# Patient Record
Sex: Male | Born: 1940 | Race: White | Hispanic: No | Smoking: Former smoker
Health system: Southern US, Community
[De-identification: ages and names within clinical notes are randomized; demographics above are authoritative.]

## PROBLEM LIST (undated history)

## (undated) DIAGNOSIS — M797 Fibromyalgia: Secondary | ICD-10-CM

## (undated) DIAGNOSIS — M7710 Lateral epicondylitis, unspecified elbow: Secondary | ICD-10-CM

## (undated) DIAGNOSIS — N529 Male erectile dysfunction, unspecified: Secondary | ICD-10-CM

## (undated) DIAGNOSIS — T884XXA Failed or difficult intubation, initial encounter: Secondary | ICD-10-CM

## (undated) DIAGNOSIS — F419 Anxiety disorder, unspecified: Secondary | ICD-10-CM

## (undated) DIAGNOSIS — K3580 Unspecified acute appendicitis: Secondary | ICD-10-CM

## (undated) DIAGNOSIS — N281 Cyst of kidney, acquired: Secondary | ICD-10-CM

## (undated) DIAGNOSIS — R600 Localized edema: Secondary | ICD-10-CM

## (undated) DIAGNOSIS — J301 Allergic rhinitis due to pollen: Secondary | ICD-10-CM

## (undated) DIAGNOSIS — J309 Allergic rhinitis, unspecified: Secondary | ICD-10-CM

## (undated) DIAGNOSIS — K219 Gastro-esophageal reflux disease without esophagitis: Secondary | ICD-10-CM

## (undated) DIAGNOSIS — IMO0002 Reserved for concepts with insufficient information to code with codable children: Secondary | ICD-10-CM

## (undated) DIAGNOSIS — I10 Essential (primary) hypertension: Secondary | ICD-10-CM

## (undated) DIAGNOSIS — R0681 Apnea, not elsewhere classified: Secondary | ICD-10-CM

## (undated) HISTORY — DX: Apnea, not elsewhere classified: R06.81

## (undated) HISTORY — DX: Reserved for concepts with insufficient information to code with codable children: IMO0002

## (undated) HISTORY — DX: Cyst of kidney, acquired: N28.1

## (undated) HISTORY — DX: Fibromyalgia: M79.7

## (undated) HISTORY — DX: Anxiety disorder, unspecified: F41.9

## (undated) HISTORY — PX: KNEE ARTHROSCOPY: SUR90

## (undated) HISTORY — DX: Allergic rhinitis due to pollen: J30.1

## (undated) HISTORY — DX: Gastro-esophageal reflux disease without esophagitis: K21.9

## (undated) HISTORY — PX: TONSILLECTOMY: SUR1361

## (undated) HISTORY — DX: Lateral epicondylitis, unspecified elbow: M77.10

## (undated) HISTORY — PX: INGUINAL HERNIA REPAIR: SUR1180

## (undated) HISTORY — DX: Male erectile dysfunction, unspecified: N52.9

## (undated) HISTORY — DX: Allergic rhinitis, unspecified: J30.9

## (undated) HISTORY — DX: Essential (primary) hypertension: I10

## (undated) HISTORY — PX: CATARACT EXTRACTION, BILATERAL: SHX1313

## (undated) HISTORY — DX: Localized edema: R60.0

---

## 2000-09-29 ENCOUNTER — Encounter: Payer: Self-pay | Admitting: General Surgery

## 2000-10-02 ENCOUNTER — Ambulatory Visit (HOSPITAL_COMMUNITY): Admission: RE | Admit: 2000-10-02 | Discharge: 2000-10-02 | Payer: Self-pay | Admitting: General Surgery

## 2005-06-13 ENCOUNTER — Encounter: Admission: RE | Admit: 2005-06-13 | Discharge: 2005-06-13 | Payer: Self-pay | Admitting: Internal Medicine

## 2005-06-27 ENCOUNTER — Encounter: Admission: RE | Admit: 2005-06-27 | Discharge: 2005-06-27 | Payer: Self-pay | Admitting: Internal Medicine

## 2006-04-21 ENCOUNTER — Emergency Department (HOSPITAL_COMMUNITY): Admission: EM | Admit: 2006-04-21 | Discharge: 2006-04-21 | Payer: Self-pay | Admitting: Emergency Medicine

## 2008-11-11 ENCOUNTER — Emergency Department (HOSPITAL_COMMUNITY): Admission: EM | Admit: 2008-11-11 | Discharge: 2008-11-11 | Payer: Self-pay | Admitting: Emergency Medicine

## 2011-04-12 NOTE — Op Note (Signed)
[facility]  Patient:    Nathan Delgado, Nathan Delgado                  MRN: [id number] Proc. Date: 10/02/00 Adm. Date:  [id number] Attending:  Brandy Hale CC:         Winn Jock. Earl Gala, M.D.   Operative Report  PREOPERATIVE DIAGNOSIS:  Right inguinal hernia.  POSTOPERATIVE DIAGNOSIS:  Right inguinal hernia.  OPERATION PERFORMED:  Repair of right inguinal hernia with mesh Armanda Heritage repair).  SURGEON:  Angelia Mould. Derrell Lolling, M.D.  OPERATIVE INDICATIONS:  This is a 70 year old white man who who has had a bulge and some discomfort in his right groin for some time.  On examination, he has a small to medium-sized right inguinal hernia which is completely reducible.  He was advised to have this repaired electively, and he has chosen to have that done at this time.  He is brought to the operating room electively for repair of his right inguinal hernia.  DESCRIPTION OF PROCEDURE:  The patient was brought the operating room and placed supine on the operating table.  The right groin was prepped and draped in a sterile fashion.  One percent Xylocaine with epinephrine was used as a local infiltration anesthetic.  We initially used a monitored sedation technique, but because of the patients breathing pattern and repetitive tension on his abdominal wall, ultimately to have a general anesthetic.  An oblique incision was made in the right groin.  Dissection was carried down through the subcutaneous tissue, exposing the aponeurosis of the external oblique.  The external oblique was incised in the direction of its fibers, opening up the external inguinal ring.  The cord structures and ilioinguinal nerve were identified, mobilized, and preserved.  We dissected out a medium size direct inguinal hernia and separated this from the cord structures.  We reduced the direct hernia component and oversewed the overlying tissues with a running suture of 2-0 Vicryl to hold the  reduction in place through the repair.  We then skeletonized the premesenteric fibers of the cord and examined this circumferentially at the level of the internal ring.  There was no evidence for an indirect hernia.  The floor of the inguinal canal was repaired and reinforced with an onlay graft of polypropylene mesh.  The mesh was cut in appropriate shape, approximately 3 inches by 6 inches, and sutured in place with running sutures of 2-0 Prolene.  The mesh was sutured so as to generously overlap the fascia at the pubic tubercle, along the ligament inferiorly, along the internal oblique muscle superiorly.  The mesh was incised laterally so as to wrap around the cord structures at the internal ring.  The tails of the mesh were overlapped laterally and the suture lines completed.  This provided a very secure repair both medial and lateral to the internal ring, and covered the direct defect very nicely.  This allowed an adequate opening for the cord structures.  The wound was irrigated with saline.  Hemostasis was excellent.  The external oblique was closed with a running suture of 2-0 Vicryl placed in the cord structures deep to the external oblique.  The Scarpas fascia was closed with 3-0 Vicryl and the skin closed with a running subcuticular suture of 4-0 Vicryl and Steri-Strips.  Clean bandages were placed and the patient taken to the recovery room in stable condition.  Estimated blood loss was about 10-15 cc.  Complications none.  Sponge, needle, and instrument counts were correct. DD:  10/02/00 TD:  10/02/00 Job: [id number]

## 2011-05-24 ENCOUNTER — Other Ambulatory Visit: Payer: Self-pay | Admitting: Orthopaedic Surgery

## 2011-05-24 DIAGNOSIS — M25512 Pain in left shoulder: Secondary | ICD-10-CM

## 2011-06-01 ENCOUNTER — Ambulatory Visit
Admission: RE | Admit: 2011-06-01 | Discharge: 2011-06-01 | Disposition: A | Discharge status: Home / Self Care | Payer: Self-pay | Source: Ambulatory Visit | Attending: Orthopaedic Surgery | Admitting: Orthopaedic Surgery

## 2011-06-01 DIAGNOSIS — M25512 Pain in left shoulder: Secondary | ICD-10-CM

## 2011-08-29 LAB — COMPREHENSIVE METABOLIC PANEL
AST: 29 U/L (ref 0–37)
Albumin: 4 g/dL (ref 3.5–5.2)
Alkaline Phosphatase: 49 U/L (ref 39–117)
BUN: 11 mg/dL (ref 6–23)
CO2: 28 mEq/L (ref 19–32)
Chloride: 104 mEq/L (ref 96–112)
Creatinine, Ser: 0.86 mg/dL (ref 0.4–1.5)
GFR calc Af Amer: >60 mL/min (ref >60)
GFR calc non Af Amer: >60 mL/min (ref >60)
Potassium: 4 mEq/L (ref 3.5–5.1)
Total Bilirubin: 1 mg/dL (ref 0.3–1.2)

## 2011-08-29 LAB — CBC
HCT: 46.2 % (ref 39.0–52.0)
MCV: 90.3 fL (ref 78.0–100.0)
Platelets: 227 10*3/uL (ref 150–400)
RBC: 5.11 MIL/uL (ref 4.22–5.81)
WBC: 5.2 10*3/uL (ref 4.0–10.5)

## 2011-08-29 LAB — DIFFERENTIAL
Basophils Absolute: 0 10*3/uL (ref 0.0–0.1)
Basophils Relative: 0 % (ref 0–1)
Eosinophils Absolute: 0.1 10*3/uL (ref 0.0–0.7)
Eosinophils Relative: 1 % (ref 0–5)
Lymphocytes Relative: 17 % (ref 12–46)
Monocytes Absolute: 0.4 10*3/uL (ref 0.1–1.0)

## 2011-08-29 LAB — URINALYSIS, ROUTINE W REFLEX MICROSCOPIC
Glucose, UA: NEGATIVE mg/dL
Hgb urine dipstick: NEGATIVE
Specific Gravity, Urine: 1.016 (ref 1.005–1.030)

## 2011-12-09 DIAGNOSIS — M25519 Pain in unspecified shoulder: Secondary | ICD-10-CM | POA: Diagnosis not present

## 2011-12-17 DIAGNOSIS — M719 Bursopathy, unspecified: Secondary | ICD-10-CM | POA: Diagnosis not present

## 2011-12-17 DIAGNOSIS — M67919 Unspecified disorder of synovium and tendon, unspecified shoulder: Secondary | ICD-10-CM | POA: Diagnosis not present

## 2011-12-17 DIAGNOSIS — M25519 Pain in unspecified shoulder: Secondary | ICD-10-CM | POA: Diagnosis not present

## 2012-01-02 DIAGNOSIS — H26499 Other secondary cataract, unspecified eye: Secondary | ICD-10-CM | POA: Diagnosis not present

## 2012-02-06 DIAGNOSIS — H26499 Other secondary cataract, unspecified eye: Secondary | ICD-10-CM | POA: Diagnosis not present

## 2012-02-28 DIAGNOSIS — M25519 Pain in unspecified shoulder: Secondary | ICD-10-CM | POA: Diagnosis not present

## 2012-03-27 DIAGNOSIS — H10019 Acute follicular conjunctivitis, unspecified eye: Secondary | ICD-10-CM | POA: Diagnosis not present

## 2012-04-21 DIAGNOSIS — I10 Essential (primary) hypertension: Secondary | ICD-10-CM | POA: Diagnosis not present

## 2012-04-21 DIAGNOSIS — Z1331 Encounter for screening for depression: Secondary | ICD-10-CM | POA: Diagnosis not present

## 2012-04-21 DIAGNOSIS — Z0000 Encounter for general adult medical examination without abnormal findings: Secondary | ICD-10-CM | POA: Diagnosis not present

## 2012-05-18 DIAGNOSIS — L719 Rosacea, unspecified: Secondary | ICD-10-CM | POA: Diagnosis not present

## 2012-06-11 DIAGNOSIS — H10439 Chronic follicular conjunctivitis, unspecified eye: Secondary | ICD-10-CM | POA: Diagnosis not present

## 2012-06-29 DIAGNOSIS — H10439 Chronic follicular conjunctivitis, unspecified eye: Secondary | ICD-10-CM | POA: Diagnosis not present

## 2012-07-09 DIAGNOSIS — H10439 Chronic follicular conjunctivitis, unspecified eye: Secondary | ICD-10-CM | POA: Diagnosis not present

## 2012-08-24 DIAGNOSIS — H10439 Chronic follicular conjunctivitis, unspecified eye: Secondary | ICD-10-CM | POA: Diagnosis not present

## 2012-08-28 DIAGNOSIS — Z23 Encounter for immunization: Secondary | ICD-10-CM | POA: Diagnosis not present

## 2012-10-20 DIAGNOSIS — IMO0001 Reserved for inherently not codable concepts without codable children: Secondary | ICD-10-CM | POA: Diagnosis not present

## 2012-10-20 DIAGNOSIS — I10 Essential (primary) hypertension: Secondary | ICD-10-CM | POA: Diagnosis not present

## 2013-01-28 DIAGNOSIS — H04129 Dry eye syndrome of unspecified lacrimal gland: Secondary | ICD-10-CM | POA: Diagnosis not present

## 2013-03-08 DIAGNOSIS — L570 Actinic keratosis: Secondary | ICD-10-CM | POA: Diagnosis not present

## 2013-03-08 DIAGNOSIS — D235 Other benign neoplasm of skin of trunk: Secondary | ICD-10-CM | POA: Diagnosis not present

## 2013-04-22 DIAGNOSIS — IMO0001 Reserved for inherently not codable concepts without codable children: Secondary | ICD-10-CM | POA: Diagnosis not present

## 2013-04-22 DIAGNOSIS — Z0000 Encounter for general adult medical examination without abnormal findings: Secondary | ICD-10-CM | POA: Diagnosis not present

## 2013-04-22 DIAGNOSIS — Z1331 Encounter for screening for depression: Secondary | ICD-10-CM | POA: Diagnosis not present

## 2013-04-22 DIAGNOSIS — I10 Essential (primary) hypertension: Secondary | ICD-10-CM | POA: Diagnosis not present

## 2013-04-22 DIAGNOSIS — K219 Gastro-esophageal reflux disease without esophagitis: Secondary | ICD-10-CM | POA: Diagnosis not present

## 2013-04-27 DIAGNOSIS — L570 Actinic keratosis: Secondary | ICD-10-CM | POA: Diagnosis not present

## 2013-06-05 DIAGNOSIS — J020 Streptococcal pharyngitis: Secondary | ICD-10-CM | POA: Diagnosis not present

## 2013-09-05 DIAGNOSIS — Z23 Encounter for immunization: Secondary | ICD-10-CM | POA: Diagnosis not present

## 2013-09-30 DIAGNOSIS — I10 Essential (primary) hypertension: Secondary | ICD-10-CM | POA: Diagnosis not present

## 2013-09-30 DIAGNOSIS — J310 Chronic rhinitis: Secondary | ICD-10-CM | POA: Diagnosis not present

## 2013-10-26 DIAGNOSIS — D235 Other benign neoplasm of skin of trunk: Secondary | ICD-10-CM | POA: Diagnosis not present

## 2013-10-26 DIAGNOSIS — B351 Tinea unguium: Secondary | ICD-10-CM | POA: Diagnosis not present

## 2013-10-26 DIAGNOSIS — L570 Actinic keratosis: Secondary | ICD-10-CM | POA: Diagnosis not present

## 2013-11-23 DIAGNOSIS — K649 Unspecified hemorrhoids: Secondary | ICD-10-CM | POA: Diagnosis not present

## 2013-11-23 DIAGNOSIS — R109 Unspecified abdominal pain: Secondary | ICD-10-CM | POA: Diagnosis not present

## 2013-11-30 DIAGNOSIS — R82998 Other abnormal findings in urine: Secondary | ICD-10-CM | POA: Diagnosis not present

## 2013-12-17 DIAGNOSIS — H04129 Dry eye syndrome of unspecified lacrimal gland: Secondary | ICD-10-CM | POA: Diagnosis not present

## 2014-01-23 HISTORY — PX: COLONOSCOPY: SHX174

## 2014-02-10 DIAGNOSIS — Z1211 Encounter for screening for malignant neoplasm of colon: Secondary | ICD-10-CM | POA: Diagnosis not present

## 2014-02-10 DIAGNOSIS — K5730 Diverticulosis of large intestine without perforation or abscess without bleeding: Secondary | ICD-10-CM | POA: Diagnosis not present

## 2014-04-29 DIAGNOSIS — Z23 Encounter for immunization: Secondary | ICD-10-CM | POA: Diagnosis not present

## 2014-04-29 DIAGNOSIS — Z1331 Encounter for screening for depression: Secondary | ICD-10-CM | POA: Diagnosis not present

## 2014-04-29 DIAGNOSIS — Z0000 Encounter for general adult medical examination without abnormal findings: Secondary | ICD-10-CM | POA: Diagnosis not present

## 2014-04-29 DIAGNOSIS — I10 Essential (primary) hypertension: Secondary | ICD-10-CM | POA: Diagnosis not present

## 2014-08-11 DIAGNOSIS — J309 Allergic rhinitis, unspecified: Secondary | ICD-10-CM | POA: Diagnosis not present

## 2014-08-11 DIAGNOSIS — Z8719 Personal history of other diseases of the digestive system: Secondary | ICD-10-CM | POA: Diagnosis not present

## 2014-08-11 DIAGNOSIS — IMO0001 Reserved for inherently not codable concepts without codable children: Secondary | ICD-10-CM | POA: Diagnosis not present

## 2014-08-11 DIAGNOSIS — I10 Essential (primary) hypertension: Secondary | ICD-10-CM | POA: Diagnosis not present

## 2014-08-11 DIAGNOSIS — F411 Generalized anxiety disorder: Secondary | ICD-10-CM | POA: Diagnosis not present

## 2014-08-11 DIAGNOSIS — Z23 Encounter for immunization: Secondary | ICD-10-CM | POA: Diagnosis not present

## 2014-08-11 DIAGNOSIS — K219 Gastro-esophageal reflux disease without esophagitis: Secondary | ICD-10-CM | POA: Diagnosis not present

## 2014-11-01 DIAGNOSIS — D1801 Hemangioma of skin and subcutaneous tissue: Secondary | ICD-10-CM | POA: Diagnosis not present

## 2014-11-01 DIAGNOSIS — L814 Other melanin hyperpigmentation: Secondary | ICD-10-CM | POA: Diagnosis not present

## 2014-11-01 DIAGNOSIS — L821 Other seborrheic keratosis: Secondary | ICD-10-CM | POA: Diagnosis not present

## 2014-12-01 ENCOUNTER — Encounter: Payer: Self-pay | Admitting: *Deleted

## 2015-02-09 DIAGNOSIS — H43393 Other vitreous opacities, bilateral: Secondary | ICD-10-CM | POA: Diagnosis not present

## 2015-02-09 DIAGNOSIS — H04123 Dry eye syndrome of bilateral lacrimal glands: Secondary | ICD-10-CM | POA: Diagnosis not present

## 2015-02-10 DIAGNOSIS — K219 Gastro-esophageal reflux disease without esophagitis: Secondary | ICD-10-CM | POA: Diagnosis not present

## 2015-02-10 DIAGNOSIS — F419 Anxiety disorder, unspecified: Secondary | ICD-10-CM | POA: Diagnosis not present

## 2015-02-10 DIAGNOSIS — I10 Essential (primary) hypertension: Secondary | ICD-10-CM | POA: Diagnosis not present

## 2015-02-10 DIAGNOSIS — Z8719 Personal history of other diseases of the digestive system: Secondary | ICD-10-CM | POA: Diagnosis not present

## 2015-02-10 DIAGNOSIS — J309 Allergic rhinitis, unspecified: Secondary | ICD-10-CM | POA: Diagnosis not present

## 2015-02-10 DIAGNOSIS — M797 Fibromyalgia: Secondary | ICD-10-CM | POA: Diagnosis not present

## 2015-03-31 DIAGNOSIS — K219 Gastro-esophageal reflux disease without esophagitis: Secondary | ICD-10-CM | POA: Diagnosis not present

## 2015-03-31 DIAGNOSIS — I10 Essential (primary) hypertension: Secondary | ICD-10-CM | POA: Diagnosis not present

## 2015-03-31 DIAGNOSIS — J309 Allergic rhinitis, unspecified: Secondary | ICD-10-CM | POA: Diagnosis not present

## 2015-03-31 DIAGNOSIS — J019 Acute sinusitis, unspecified: Secondary | ICD-10-CM | POA: Diagnosis not present

## 2015-04-04 DIAGNOSIS — K219 Gastro-esophageal reflux disease without esophagitis: Secondary | ICD-10-CM | POA: Diagnosis not present

## 2015-04-04 DIAGNOSIS — R42 Dizziness and giddiness: Secondary | ICD-10-CM | POA: Diagnosis not present

## 2015-04-04 DIAGNOSIS — R499 Unspecified voice and resonance disorder: Secondary | ICD-10-CM | POA: Diagnosis not present

## 2015-04-18 ENCOUNTER — Ambulatory Visit (INDEPENDENT_AMBULATORY_CARE_PROVIDER_SITE_OTHER): Payer: Medicare Other | Admitting: Neurology

## 2015-04-18 ENCOUNTER — Encounter: Payer: Self-pay | Admitting: Neurology

## 2015-04-18 VITALS — BP 151/82 | HR 81 | Temp 98.1°F | Ht 69.5 in | Wt 223.8 lb

## 2015-04-18 DIAGNOSIS — G5710 Meralgia paresthetica, unspecified lower limb: Secondary | ICD-10-CM | POA: Diagnosis not present

## 2015-04-18 DIAGNOSIS — M797 Fibromyalgia: Secondary | ICD-10-CM

## 2015-04-18 MED ORDER — PREGABALIN 50 MG PO CAPS: 50.0000 mg | ORAL_CAPSULE | Freq: Two times a day (BID) | ORAL | Status: DC

## 2015-04-18 NOTE — Patient Instructions (Addendum)
Overall you are doing fairly well but I do want to suggest a few things today:   Remember to drink plenty of fluid, eat healthy meals and do not skip any meals. Try to eat protein with a every meal and eat a healthy snack such as fruit or nuts in between meals. Try to keep a regular sleep-wake schedule and try to exercise daily, particularly in the form of walking, 20-30 minutes a day, if you can.   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 If needed can increase Lyrice to 50mg  three times a day or 100mg  twice daily.  I would like to see you back as needed, sooner if we need to. Please call us with any interim questions, concerns, problems, updates or refill requests.   Please also call us for any test results so we can go over those with you on the phone.  My clinical assistant and will answer any of your questions and relay your messages to me and also relay most of my messages to you.   Our phone number is [phone]. We also have an after hours call service for urgent matters and there is a physician on-call for urgent questions. For any emergencies you know to call 911 or go to the nearest emergency room

## 2015-04-18 NOTE — Progress Notes (Signed)
[facility]    Provider:  Dr Jaynee Eagles Referring Provider: Melissa Montane, MD Primary Care Physician:  Gara Kroner, MD  CC:  Burning thighs  HPI:  Nathan Delgado is a 74 y.o. male here as a referral from Dr. Janace Hoard for burning thighs. PMHx HTN and anxiety.  He has burning in his anterior lateral thighs, 15 years ago he was diagnosed as having fibromyalgia He ws given clorazepate and taking it for years. Balance is ok. It is the sensation in his legs that is really bothering him. Burning happens every day, happens with sitting or standing. The burning feeling is symmetrical and in the anterior thighs, radiates a little past the knees. Symptoms have been worsening over the last 3 months. No known inciting factors but he has gained weight as the symptoms worsened. This is not muscle burning, like he went to the gym - it is more superficial. Wellbutrin not helping, was started due to Fibromyalgia. He has anxiety. Symptoms have gotten more severe, more noticeable, more aggravating. Ibuprofen and clorazepate helps. He does not have significant low back pain or radicular symptoms. No bowel or bladder changes. Denies weakness, denies distal paresthesias or numbness in his toes, denies neck pain or any other focal neurologic symptoms.    Reviewed notes, labs and imaging from outside physicians, which showed:   Ct of the head in 2009 showed No acute intracranial abnormalities including mass lesion or mass effect, hydrocephalus, extra-axial fluid collection, midline shift, hemorrhage, or acute infarction, large ischemic events (personally reviewed images)     Review of Systems: Patient complains of symptoms per HPI as well as the following symptoms: weakness, aching muscles, allergies, weakness, anxiety. Pertinent negatives per HPI. All others negative.   History   Social History  . Marital Status: Divorced    Spouse Name: N/A  . Number of Children: 2  . Years of Education:  College   Occupational History  . Retired     Social History Main Topics  . Smoking status: Former Research scientist (life sciences)  . Smokeless tobacco: Not on file  . Alcohol Use: 0.0 oz/week    0 Standard drinks or equivalent per week     Comment: 2 drinks per day  . Drug Use: No  . Sexual Activity: Not on file   Other Topics Concern  . Not on file   Social History Narrative   Lives at home    Caffeine use: 1 cup coffee per day     Family History  Problem Relation Age of Onset  . Cancer Mother   . Dementia Father   . Depression Father   . Macular degeneration Father     Past Medical History  Diagnosis Date  . Fibromyalgia   . Hypertension   . Allergic rhinitis   . GERD (gastroesophageal reflux disease)   . ED (erectile dysfunction)   . Paronychia   . Lateral epicondylitis     Past Surgical History  Procedure Laterality Date  . Knee arthroscopy    . Inguinal hernia repair    . Tonsillectomy    . Cataract extraction, bilateral      Current Outpatient Prescriptions  Medication Sig Dispense Refill  . amLODipine (NORVASC) 10 MG tablet Take 10 mg by mouth daily.    Marland Kitchen aspirin 81 MG chewable tablet Chew by mouth daily.    Marland Kitchen buPROPion (WELLBUTRIN XL) 300 MG 24 hr tablet Take 300 mg by mouth daily.    . clorazepate (TRANXENE) 3.75 MG tablet Take 3.75  mg by mouth at bedtime. Take 1/2  To 1 tablet up to three times a day    . fluticasone (FLONASE) 50 MCG/ACT nasal spray Place 1 spray into both nostrils as needed for allergies or rhinitis.    Marland Kitchen loratadine (CLARITIN) 10 MG tablet Take 10 mg by mouth daily.    . Multiple Vitamin (MULTIVITAMIN) tablet Take 1 tablet by mouth daily.    Marland Kitchen omeprazole (PRILOSEC) 20 MG capsule Take 20 mg by mouth daily.    . pregabalin (LYRICA) 50 MG capsule Take 1 capsule (50 mg total) by mouth 2 (two) times daily. 60 capsule 6   No current facility-administered medications for this visit.    Allergies as of 04/18/2015  . (No Known Allergies)    Vitals: BP  151/82 mmHg  Pulse 81  Temp(Src) 98.1 F (36.7 C)  Ht 5' 9.5" (1.765 m)  Wt 223 lb 12.8 oz (101.515 kg)  BMI 32.59 kg/m2 Last Weight:  Wt Readings from Last 1 Encounters:  04/18/15 223 lb 12.8 oz (101.515 kg)   Last Height:   Ht Readings from Last 1 Encounters:  04/18/15 5' 9.5" (1.765 m)    Physical exam: Exam: Gen: NAD, conversant, well nourised, obese, well groomed                     CV: RRR, no MRG. No Carotid Bruits. No peripheral edema, warm, nontender Eyes: Conjunctivae clear without exudates or hemorrhage  Neuro: Detailed Neurologic Exam  Speech:    Speech is normal; fluent and spontaneous with normal comprehension.  Cognition:    The patient is oriented to person, place, and time;     recent and remote memory intact;     language fluent;     normal attention, concentration,     fund of knowledge Cranial Nerves:    The pupils are equal, round, and reactive to light. The fundi are flat. Visual fields are full to finger confrontation. Extraocular movements are intact. Trigeminal sensation is intact and the muscles of mastication are normal. The face is symmetric. The palate elevates in the midline. Hearing intact. Voice is normal. Shoulder shrug is normal. The tongue has normal motion without fasciculations.   Coordination:    Normal finger to nose and heel to shin. Normal rapid alternating movements.   Gait:    Heel-toe and tandem gait are normal.   Motor Observation:    No asymmetry, no atrophy, and no involuntary movements noted. Tone:    Normal muscle tone.    Posture:    Posture is normal. normal erect    Strength:    Strength is V/V in the upper and lower limbs.      Sensation: intact to LT distally, hyperesthesia in the anterior lateral thighs.     Reflex Exam:  DTR's:    Deep tendon reflexes in the upper and lower extremities are symmetrical bilaterally.   Toes:    The toes are downgoing bilaterally.   Clonus:    Clonus is absent.       Assessment/Plan:  74 year old male with anterior lateral thigh burning for 15 years. No low back pain or significant radicular symptoms. No other focal neurologic symptoms. The symptoms have worsened concurrently with weight gain. 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 Showed patient the distribution on an image. The radiation below his knees is likely referred pain. Could also be L3/L4 radiculopathy however patient denies low back pain or radicular pain and symptoms are symmetrical which makes  this less likely. Advised weight loss, no tight pants or tight underwear. Can try Lyrica which may also help with his fibromyalgia. Patient denies any kidney disorder or chronic kidney disease. Can start low-dose Lyrica, and follow-up with primary care. Lidocaine patches also work. If symptoms do not improve and patient has significant discomfort, can refer for pain procedure and nerve ablation.    Sarina Ill, MD  [facility] [address] [place], [place] [address]  Phone [phone] Fax [phone]

## 2015-04-23 ENCOUNTER — Encounter: Payer: Self-pay | Admitting: Neurology

## 2015-04-23 DIAGNOSIS — M797 Fibromyalgia: Secondary | ICD-10-CM | POA: Insufficient documentation

## 2015-04-23 DIAGNOSIS — G5710 Meralgia paresthetica, unspecified lower limb: Secondary | ICD-10-CM | POA: Insufficient documentation

## 2015-05-16 DIAGNOSIS — Z23 Encounter for immunization: Secondary | ICD-10-CM | POA: Diagnosis not present

## 2015-05-30 ENCOUNTER — Telehealth: Payer: Self-pay | Admitting: Neurology

## 2015-05-30 MED ORDER — PREGABALIN 50 MG PO CAPS: 50.0000 mg | ORAL_CAPSULE | Freq: Two times a day (BID) | ORAL | Status: DC

## 2015-05-30 NOTE — Telephone Encounter (Signed)
I will write the Rx.

## 2015-05-30 NOTE — Telephone Encounter (Signed)
Info provided was for [facility].  This pharmacy has been added to the chart.  Dr Jaynee Eagles is out of the office today.  Request has been sent to [facility] for approval.

## 2015-05-30 NOTE — Telephone Encounter (Signed)
Patient called and requested that his Rx. pregabalin (LYRICA) 50 MG capsule be written for a 90 day supply. Please call and advise. ([place] [phone] Fax-[phone])

## 2015-06-01 NOTE — Telephone Encounter (Signed)
Patient called stating to disregard phone message. He just received confirmation from [facility] for medication. Thanks

## 2015-06-01 NOTE — Telephone Encounter (Addendum)
Patient called inquiring if Lyrica had been called to pharmacy. He has 8 pills left.  Please call and advise. Patient can be reached at [phone].

## 2015-06-07 DIAGNOSIS — T148 Other injury of unspecified body region: Secondary | ICD-10-CM | POA: Diagnosis not present

## 2015-06-23 ENCOUNTER — Telehealth: Payer: Self-pay | Admitting: Neurology

## 2015-06-23 NOTE — Telephone Encounter (Signed)
Lyrica can cause peripheral edema. Spoke to patient. Only way to tell is to stop lyrica. He has a pcp appointment. If he experiences cp, sob or worsening edema he should call pcp or go to ED.

## 2015-06-23 NOTE — Telephone Encounter (Signed)
Called back to pt. He stated that he started lyrica on 04/18/15 and only about early this week, he started to notice foot swelling. He thought it was circulation problem so he started to work out more, more walking and more exercise. He found yesterday that the swelling seems getting worse, at both upper part of feet, right more than left. He is wondering whether this is related to lyrica. He denies any heart issues in the past.   I explained to him that he started lyrica 2 months ago and only for this week he had foot swelling, it is hard to correlated this two things temporarily. It is likely that his longer hours sitting or standing and vigorous exercise may causing the more swelling of the feet. I encourage him to elevated his leg when sitting or lying at night to help the swelling. He will call his PCP if swelling does not go down in the next a couple of days.   Since "peripheral edema" also listed in the adverse effect of lyrica, I told him that I can not completely exclude the chance of swelling is caused by lyrica, but low suspicious. Pt is welcome to call back to discuss with Dr. Jaynee Eagles if PCP still concerns the swelling is from lyrica and we can change medications. Pt expressed understanding and appreciation.  Rosalin Hawking, MD PhD Stroke Neurology 06/23/2015 5:03 PM

## 2015-06-23 NOTE — Telephone Encounter (Signed)
I called the patient back, but got no answer.  Previous portion of this message says the patient is expiriencing swelling in feet since starting Lyrica.  Dr Jaynee Eagles is not in the office today.  Forwarding request to [facility] for review.

## 2015-06-23 NOTE — Telephone Encounter (Signed)
Dr Jaynee Eagles put pt on 50mg  lyrica twice per day, experiencing swelling in feet

## 2015-06-27 DIAGNOSIS — R202 Paresthesia of skin: Secondary | ICD-10-CM | POA: Diagnosis not present

## 2015-06-27 DIAGNOSIS — I10 Essential (primary) hypertension: Secondary | ICD-10-CM | POA: Diagnosis not present

## 2015-06-27 DIAGNOSIS — K219 Gastro-esophageal reflux disease without esophagitis: Secondary | ICD-10-CM | POA: Diagnosis not present

## 2015-06-27 DIAGNOSIS — F419 Anxiety disorder, unspecified: Secondary | ICD-10-CM | POA: Diagnosis not present

## 2015-06-27 DIAGNOSIS — J309 Allergic rhinitis, unspecified: Secondary | ICD-10-CM | POA: Diagnosis not present

## 2015-06-27 DIAGNOSIS — R609 Edema, unspecified: Secondary | ICD-10-CM | POA: Diagnosis not present

## 2015-06-27 DIAGNOSIS — Z1389 Encounter for screening for other disorder: Secondary | ICD-10-CM | POA: Diagnosis not present

## 2015-07-10 NOTE — Telephone Encounter (Signed)
If the swelling in his legs is ok, he can stay on one tab a day if it is still helping. If he believes it was the blood pressure that was causing the swelling, then he can try to increase lyrica to twice daily again and see what happens. Please let him know, thanks Thanks.

## 2015-07-10 NOTE — Telephone Encounter (Signed)
Pt is calling back about Lyrica. Dr Jaynee Eagles recently cut it down to 1 due to swelling in both feet, shortly thereafter Dr Moreen Fowler increased amlodipine due to BP being up. Is cutting back to 1 ok? (1 seems to be doing fine) or does she want him to stop it?

## 2015-07-11 NOTE — Telephone Encounter (Signed)
Called pt back and told him Dr. Jaynee Eagles stated if the swelling is okay in his legs, he can stay at 1 tablet lyrica a day. If he thinks it was his BP medication, then he can try increasing to 2 tablets lyrica/day. He stated he decreased already to 1 tablet Lyrica yesterday and going to try this for a week to see if swelling gets better. He is taking 1/2 tablet BP medication. He will give this a week and increase to 2 tablets lyrica if he is doing well after a week. He has not had any nerve pain, even taking just 1 tablet lyrica. Told him to call our office back with any new/worsening symptoms and to inform us when he increase to 2 tablets lyrica/day. He verbalized understanding.

## 2015-07-12 NOTE — Telephone Encounter (Signed)
Thank you :)

## 2015-07-13 DIAGNOSIS — M25571 Pain in right ankle and joints of right foot: Secondary | ICD-10-CM | POA: Diagnosis not present

## 2015-07-13 DIAGNOSIS — M898X6 Other specified disorders of bone, lower leg: Secondary | ICD-10-CM | POA: Diagnosis not present

## 2015-07-17 ENCOUNTER — Telehealth: Payer: Self-pay | Admitting: Neurology

## 2015-07-17 NOTE — Telephone Encounter (Signed)
Patient is calling as he is experiencing swelling in feet and legs. He has already reduced the Lyrica dosage and reduced his amdolopine dosage also. Is it OK for him to stop the Lyrica all together. Please call.

## 2015-07-17 NOTE — Telephone Encounter (Signed)
Called pt back. Advised him Dr. Jaynee Eagles said it was okay for him to stop Lyrica. Told him to call back if he had further questions. He verbalized understanding.

## 2015-07-17 NOTE — Telephone Encounter (Signed)
Yes he can stop it, please let patient know.

## 2015-07-25 DIAGNOSIS — R600 Localized edema: Secondary | ICD-10-CM | POA: Diagnosis not present

## 2015-07-26 DIAGNOSIS — F419 Anxiety disorder, unspecified: Secondary | ICD-10-CM | POA: Diagnosis not present

## 2015-07-26 DIAGNOSIS — Z23 Encounter for immunization: Secondary | ICD-10-CM | POA: Diagnosis not present

## 2015-07-26 DIAGNOSIS — Z6832 Body mass index (BMI) 32.0-32.9, adult: Secondary | ICD-10-CM | POA: Diagnosis not present

## 2015-07-26 DIAGNOSIS — Z1389 Encounter for screening for other disorder: Secondary | ICD-10-CM | POA: Diagnosis not present

## 2015-07-26 DIAGNOSIS — R05 Cough: Secondary | ICD-10-CM | POA: Diagnosis not present

## 2015-07-26 DIAGNOSIS — R600 Localized edema: Secondary | ICD-10-CM | POA: Diagnosis not present

## 2015-07-26 DIAGNOSIS — I10 Essential (primary) hypertension: Secondary | ICD-10-CM | POA: Diagnosis not present

## 2015-08-08 DIAGNOSIS — I83813 Varicose veins of bilateral lower extremities with pain: Secondary | ICD-10-CM | POA: Diagnosis not present

## 2015-08-22 DIAGNOSIS — I87321 Chronic venous hypertension (idiopathic) with inflammation of right lower extremity: Secondary | ICD-10-CM | POA: Diagnosis not present

## 2015-10-26 DIAGNOSIS — Z6832 Body mass index (BMI) 32.0-32.9, adult: Secondary | ICD-10-CM | POA: Diagnosis not present

## 2015-10-26 DIAGNOSIS — R600 Localized edema: Secondary | ICD-10-CM | POA: Diagnosis not present

## 2015-10-26 DIAGNOSIS — N529 Male erectile dysfunction, unspecified: Secondary | ICD-10-CM | POA: Diagnosis not present

## 2015-10-26 DIAGNOSIS — I10 Essential (primary) hypertension: Secondary | ICD-10-CM | POA: Diagnosis not present

## 2015-10-26 DIAGNOSIS — R42 Dizziness and giddiness: Secondary | ICD-10-CM | POA: Diagnosis not present

## 2015-12-25 DIAGNOSIS — L812 Freckles: Secondary | ICD-10-CM | POA: Diagnosis not present

## 2015-12-25 DIAGNOSIS — L570 Actinic keratosis: Secondary | ICD-10-CM | POA: Diagnosis not present

## 2015-12-25 DIAGNOSIS — L821 Other seborrheic keratosis: Secondary | ICD-10-CM | POA: Diagnosis not present

## 2015-12-25 DIAGNOSIS — Z85828 Personal history of other malignant neoplasm of skin: Secondary | ICD-10-CM | POA: Diagnosis not present

## 2015-12-25 DIAGNOSIS — D235 Other benign neoplasm of skin of trunk: Secondary | ICD-10-CM | POA: Diagnosis not present

## 2016-02-15 DIAGNOSIS — H04123 Dry eye syndrome of bilateral lacrimal glands: Secondary | ICD-10-CM | POA: Diagnosis not present

## 2016-02-15 DIAGNOSIS — H524 Presbyopia: Secondary | ICD-10-CM | POA: Diagnosis not present

## 2016-02-20 DIAGNOSIS — I10 Essential (primary) hypertension: Secondary | ICD-10-CM | POA: Diagnosis not present

## 2016-02-20 DIAGNOSIS — Z125 Encounter for screening for malignant neoplasm of prostate: Secondary | ICD-10-CM | POA: Diagnosis not present

## 2016-02-27 DIAGNOSIS — K219 Gastro-esophageal reflux disease without esophagitis: Secondary | ICD-10-CM | POA: Diagnosis not present

## 2016-02-27 DIAGNOSIS — Z23 Encounter for immunization: Secondary | ICD-10-CM | POA: Diagnosis not present

## 2016-02-27 DIAGNOSIS — F418 Other specified anxiety disorders: Secondary | ICD-10-CM | POA: Diagnosis not present

## 2016-02-27 DIAGNOSIS — Z1389 Encounter for screening for other disorder: Secondary | ICD-10-CM | POA: Diagnosis not present

## 2016-02-27 DIAGNOSIS — I10 Essential (primary) hypertension: Secondary | ICD-10-CM | POA: Diagnosis not present

## 2016-02-27 DIAGNOSIS — Z6831 Body mass index (BMI) 31.0-31.9, adult: Secondary | ICD-10-CM | POA: Diagnosis not present

## 2016-02-27 DIAGNOSIS — Z0000 Encounter for general adult medical examination without abnormal findings: Secondary | ICD-10-CM | POA: Diagnosis not present

## 2016-02-29 ENCOUNTER — Other Ambulatory Visit: Payer: Self-pay | Admitting: Internal Medicine

## 2016-02-29 DIAGNOSIS — Z1212 Encounter for screening for malignant neoplasm of rectum: Secondary | ICD-10-CM | POA: Diagnosis not present

## 2016-02-29 DIAGNOSIS — Z87891 Personal history of nicotine dependence: Secondary | ICD-10-CM

## 2016-03-05 ENCOUNTER — Ambulatory Visit
Admission: RE | Admit: 2016-03-05 | Discharge: 2016-03-05 | Disposition: A | Discharge status: Home / Self Care | Payer: Medicare Other | Source: Ambulatory Visit | Attending: Internal Medicine | Admitting: Internal Medicine

## 2016-03-05 DIAGNOSIS — Z87891 Personal history of nicotine dependence: Secondary | ICD-10-CM | POA: Diagnosis not present

## 2016-03-05 DIAGNOSIS — I77811 Abdominal aortic ectasia: Secondary | ICD-10-CM | POA: Diagnosis not present

## 2016-03-05 DIAGNOSIS — Z136 Encounter for screening for cardiovascular disorders: Secondary | ICD-10-CM | POA: Diagnosis not present

## 2016-03-07 ENCOUNTER — Other Ambulatory Visit: Payer: Medicare Other

## 2016-04-11 DIAGNOSIS — H0011 Chalazion right upper eyelid: Secondary | ICD-10-CM | POA: Diagnosis not present

## 2016-04-18 DIAGNOSIS — H0011 Chalazion right upper eyelid: Secondary | ICD-10-CM | POA: Diagnosis not present

## 2016-04-18 DIAGNOSIS — L719 Rosacea, unspecified: Secondary | ICD-10-CM | POA: Diagnosis not present

## 2016-05-06 DIAGNOSIS — F418 Other specified anxiety disorders: Secondary | ICD-10-CM | POA: Diagnosis not present

## 2016-05-06 DIAGNOSIS — I10 Essential (primary) hypertension: Secondary | ICD-10-CM | POA: Diagnosis not present

## 2016-05-06 DIAGNOSIS — F5104 Psychophysiologic insomnia: Secondary | ICD-10-CM | POA: Diagnosis not present

## 2016-05-06 DIAGNOSIS — Z6831 Body mass index (BMI) 31.0-31.9, adult: Secondary | ICD-10-CM | POA: Diagnosis not present

## 2016-05-09 DIAGNOSIS — L719 Rosacea, unspecified: Secondary | ICD-10-CM | POA: Diagnosis not present

## 2016-05-09 DIAGNOSIS — H0011 Chalazion right upper eyelid: Secondary | ICD-10-CM | POA: Diagnosis not present

## 2016-05-21 ENCOUNTER — Encounter: Payer: Self-pay | Admitting: Neurology

## 2016-05-21 ENCOUNTER — Ambulatory Visit (INDEPENDENT_AMBULATORY_CARE_PROVIDER_SITE_OTHER): Payer: Medicare Other | Admitting: Neurology

## 2016-05-21 VITALS — BP 126/68 | HR 70 | Resp 18 | Ht 69.5 in | Wt 223.0 lb

## 2016-05-21 DIAGNOSIS — R351 Nocturia: Secondary | ICD-10-CM

## 2016-05-21 DIAGNOSIS — G479 Sleep disorder, unspecified: Secondary | ICD-10-CM | POA: Diagnosis not present

## 2016-05-21 DIAGNOSIS — E663 Overweight: Secondary | ICD-10-CM

## 2016-05-21 NOTE — Patient Instructions (Addendum)
Based on your symptoms and your exam I believe you are at some risk for obstructive sleep apnea or OSA, and I think we should consider bringing you back for a sleep study to determine whether you do or do not have OSA and how severe it is. If you have more than mild OSA, I want you to consider treatment with CPAP. Please remember, the risks and ramifications of moderate to severe obstructive sleep apnea or OSA are: Cardiovascular disease, including congestive heart failure, stroke, difficult to control hypertension, arrhythmias, and even type 2 diabetes has been linked to untreated OSA. Sleep apnea causes disruption of sleep and sleep deprivation in most cases, which, in turn, can cause recurrent headaches, problems with memory, mood, concentration, focus, and vigilance. Most people with untreated sleep apnea report excessive daytime sleepiness, which can affect their ability to drive. Please do not drive if you feel sleepy.   I will likely see you back after your sleep study to go over the test results and where to go from there. We will call you after your sleep study to advise about the results (most likely, you will hear from Beverlee Nims, my nurse) and to set up an appointment at the time, as necessary.    Our sleep lab administrative assistant, Arrie Aran will meet with you or call you to schedule your sleep study. If you don't hear back from her by next week please feel free to call her at [phone]. This is her direct line and please leave a message with your phone number to call back if you get the voicemail box. She will call back as soon as possible.

## 2016-05-21 NOTE — Progress Notes (Signed)
Subjective:    Patient ID: Nathan Delgado is a 75 y.o. male.  HPI     Star Age, MD, PhD [facility] [address], [address] [place], [place] [address]  Dear Dr. Philip Aspen,   I saw your patient, the midline, upon your kind request in my neurologic clinic today for initial consultation of his sleep disorder, in particular, concern for underlying obstructive sleep apnea. The patient is unaccompanied today. As you know, Nathan Delgado is a very friendly 75 year old right-handed gentleman with an underlying medical history of allergic rhinitis, hypertension, fibromyalgia, reflux disease, ED, anxiety and obesity, who reports sleep disruption, nocturia and non-restorative sleep. He does not know if he snores. He is divorced (x2) and lives alone. He reports difficulty initiating and maintaining sleep. I reviewed your office note from 05/06/2016, which you kindly included.  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 He has 2 grown children, 4 grandchildren. He is retired. He quit smoking in 1983. He drinks about 2 alcoholic beverages per day, coffee 1-2 cups per day. He goes to bed around 11 PM and watches TV, puts it on a timer. Usually turns the TV off before the time and turns it off. He falls asleep fairly quickly. Until about a month ago he had significant sleep disruption, particularly from having to go to the bathroom 3-4 times on an average night. Since his recent medication changes he has had improvement in his sleep disruption and nocturia and gets up on average once per night. His wake up time is around 7:30 to 8 AM. He feels adequately or reasonably rested when he first wakes up. He denies restless leg symptoms or morning headaches. He does not take any naps. Sometimes when he comes home after working out with a trainer he doses off briefly. His recent medication changes include discontinuation of amlodipine and Lyrica, reduction in Wellbutrin, new blood  pressure medication and he was then restarted on low-dose Lyrica, currently at 50 mg twice daily with good tolerance. He had some intermittent tingling which have improved. He has some symptoms in keeping with a carpal tunnel syndrome. His weight has been stable for the past 5-7 years. Epworth Sleepiness Scale score is 2 out of 24 today, his fatigue score is 11 out of 63.  His Past Medical History Is Significant For: Past Medical History  Diagnosis Date  . Fibromyalgia   . Hypertension   . Allergic rhinitis   . GERD (gastroesophageal reflux disease)   . ED (erectile dysfunction)   . Paronychia   . Lateral epicondylitis     Her Past Surgical History Is Significant For: Past Surgical History  Procedure Laterality Date  . Knee arthroscopy    . Inguinal hernia repair    . Tonsillectomy    . Cataract extraction, bilateral      His Family History Is Significant For: Family History  Problem Relation Age of Onset  . Cancer Mother   . Dementia Father   . Depression Father   . Macular degeneration Father   . Neuropathy Neg Hx     His Social History Is Significant For: Social History   Social History  . Marital Status: Divorced    Spouse Name: N/A  . Number of Children: 2  . Years of Education: College   Occupational History  . Retired     Social History Main Topics  . Smoking status: Former Research scientist (life sciences)  . Smokeless tobacco: None  . Alcohol  Use: 0.0 oz/week    0 Standard drinks or equivalent per week     Comment: 2 drinks per day  . Drug Use: No  . Sexual Activity: Not Asked   Other Topics Concern  . None   Social History Narrative   Lives at home    Caffeine use: 2 cup coffee per day     His Allergies Are:  No Known Allergies:   His Current Medications Are:  Outpatient Encounter Prescriptions as of 05/21/2016  Medication Sig  . ALPRAZolam (XANAX) 0.25 MG tablet   . aspirin 81 MG chewable tablet Chew by mouth daily.  Marland Kitchen buPROPion (WELLBUTRIN SR) 150 MG 12 hr tablet    . Fish Oil-Cholecalciferol (OMEGA-3 + VITAMIN D3 PO) Take by mouth.  . losartan-hydrochlorothiazide (HYZAAR) 100-12.5 MG tablet   . Magnesium 250 MG TABS Take by mouth.  . Multiple Vitamin (MULTIVITAMIN) tablet Take 1 tablet by mouth daily.  Marland Kitchen omeprazole (PRILOSEC) 20 MG capsule Take 20 mg by mouth every other day.   . pregabalin (LYRICA) 50 MG capsule Take 1 capsule (50 mg total) by mouth 2 (two) times daily.  . [DISCONTINUED] amLODipine (NORVASC) 10 MG tablet Take 10 mg by mouth daily.  . [DISCONTINUED] buPROPion (WELLBUTRIN XL) 300 MG 24 hr tablet Take 300 mg by mouth daily.  . [DISCONTINUED] clorazepate (TRANXENE) 3.75 MG tablet Take 3.75 mg by mouth at bedtime. Take 1/2  To 1 tablet up to three times a day  . [DISCONTINUED] fluticasone (FLONASE) 50 MCG/ACT nasal spray Place 1 spray into both nostrils as needed for allergies or rhinitis.  . [DISCONTINUED] loratadine (CLARITIN) 10 MG tablet Take 10 mg by mouth daily.   No facility-administered encounter medications on file as of 05/21/2016.  :  Review of Systems:  Out of a complete 14 point review of systems, all are reviewed and negative with the exception of these symptoms as listed below:   Review of Systems  Neurological:       Patient states that he use to wake up several time during the night, this has decreased to about 1 time a night. Unknown if snoring. Has trouble sleeping about once a month.    Epworth Sleepiness Scale 0= would never doze 1= slight chance of dozing 2= moderate chance of dozing 3= high chance of dozing  Sitting and reading:0 Watching TV:1 Sitting inactive in a public place (ex. Theater or meeting):0 As a passenger in a car for an hour without a break:0 Lying down to rest in the afternoon:1 Sitting and talking to someone:0 Sitting quietly after lunch (no alcohol):0 In a car, while stopped in traffic:0 Total:2  Objective:  Neurologic Exam  Physical Exam Physical Examination:   Filed Vitals:    05/21/16 1442  BP: 126/68  Pulse: 70  Resp: 18    General Examination: The patient is a very pleasant 75 y.o. male in no acute distress. He appears well-developed and well-nourished and well groomed.   HEENT: Normocephalic, atraumatic, pupils are equal, round and reactive to light and accommodation. Funduscopic exam is normal with sharp disc margins noted. Extraocular tracking is good without limitation to gaze excursion or nystagmus noted. Normal smooth pursuit is noted. Hearing is grossly intact. Tympanic membranes are clear bilaterally. Face is symmetric with normal facial animation and normal facial sensation. Speech is clear with no dysarthria noted. There is no hypophonia. There is no lip, neck/head, jaw or voice tremor. Neck is supple with full range of passive and active motion. There  are no carotid bruits on auscultation. Oropharynx exam reveals: mild mouth dryness, adequate dental hygiene and mild airway crowding, due to redundant soft palate, large uvula. Mallampati is class II. Tongue protrudes centrally and palate elevates symmetrically. Tonsils are absent. Neck size is 17 1/8 inches. He has a Mild overbite.    Chest: Clear to auscultation without wheezing, rhonchi or crackles noted.  Heart: S1+S2+0, regular and normal without murmurs, rubs or gallops noted.   Abdomen: Soft, non-tender and non-distended with normal bowel sounds appreciated on auscultation.  Extremities: There is no pitting edema in the distal lower extremities bilaterally. Pedal pulses are intact.  Skin: Warm and dry without trophic changes noted. There are no varicose veins.  Musculoskeletal: exam reveals no obvious joint deformities, tenderness or joint swelling or erythema.   Neurologically:  Mental status: The patient is awake, alert and oriented in all 4 spheres. His immediate and remote memory, attention, language skills and fund of knowledge are appropriate. There is no evidence of aphasia, agnosia, apraxia  or anomia. Speech is clear with normal prosody and enunciation. Thought process is linear. Mood is normal and affect is normal.  Cranial nerves II - XII are as described above under HEENT exam. In addition: shoulder shrug is normal with equal shoulder height noted. Motor exam: Normal bulk, strength and tone is noted. There is no drift, tremor or rebound. Romberg is negative. Reflexes are 1+ throughout. Fine motor skills and coordination: intact with normal finger taps, normal hand movements, normal rapid alternating patting, normal foot taps and normal foot agility.  Cerebellar testing: No dysmetria or intention tremor on finger to nose testing. Heel to shin is unremarkable bilaterally. There is no truncal or gait ataxia.  Sensory exam: intact in the upper and lower extremities.  Gait, station and balance: He stands easily. No veering to one side is noted. No leaning to one side is noted. Posture is age-appropriate and stance is narrow based. Gait shows normal stride length and normal pace. No problems turning are noted. Tandem walk is slightly challenging for him.  Assessment and Plan:  In summary, ESSAM PARKMAN is a very pleasant 75 y.o.-year old male with an underlying medical history of allergic rhinitis, hypertension, fibromyalgia, reflux disease, ED, anxiety and obesity, who Reports some sleep disruption, nocturia, some daytime tiredness. While not telltale, his history and physical exam could be concerning for underlying obstructive sleep apnea (OSA). I had a long chat with the patient about my findings and the diagnosis of OSA, its prognosis and treatment options. We talked about medical treatments, surgical interventions and non-pharmacological approaches. I explained in particular the risks and ramifications of untreated moderate to severe OSA, especially with respect to developing cardiovascular disease down the Road, including congestive heart failure, difficult to treat hypertension, cardiac  arrhythmias, or stroke. Even type 2 diabetes has, in part, been linked to untreated OSA. Symptoms of untreated OSA include daytime sleepiness, memory problems, mood irritability and mood disorder such as depression and anxiety, lack of energy, as well as recurrent headaches, especially morning headaches. We talked about smoking cessation and trying to maintain a healthy lifestyle in general. I recommended the following at this time: consider sleep study with potential positive airway pressure titration. (We will score hypopneas at 4% and split the sleep study into diagnostic and treatment portion, if the estimated. 2 hour AHI is >15/h). Patient would like to discuss again with you during his next appointment which is coming up soon, especially since some his symptoms have  improved with recent medication changes.   I explained the sleep test procedure to the patient and also outlined possible surgical and non-surgical treatment options of OSA, including the use of CPAP.  as of right now, I will see him back on an as-needed basis. He will contact our office if he is ready to proceed with a sleep study. I do agree that it is not an urgent situation and we could at least wait out his next appointment with you, which he would prefer.   Thank you very much for allowing me to participate in the care of this nice patient. If I can be of any further assistance to you please do not hesitate to call me at [phone].  Sincerely,   Star Age, MD, PhD

## 2016-05-27 ENCOUNTER — Telehealth: Payer: Self-pay | Admitting: Neurology

## 2016-05-27 DIAGNOSIS — G479 Sleep disorder, unspecified: Secondary | ICD-10-CM

## 2016-05-27 DIAGNOSIS — E663 Overweight: Secondary | ICD-10-CM

## 2016-05-27 DIAGNOSIS — R351 Nocturia: Secondary | ICD-10-CM

## 2016-05-27 NOTE — Telephone Encounter (Signed)
Patient called to schedule his sleep study.  I don't see an order for it in the system.  Can I please get an order for his sleep study?

## 2016-05-27 NOTE — Telephone Encounter (Signed)
Order is in.

## 2016-06-03 DIAGNOSIS — H02841 Edema of right upper eyelid: Secondary | ICD-10-CM | POA: Diagnosis not present

## 2016-06-03 DIAGNOSIS — H04123 Dry eye syndrome of bilateral lacrimal glands: Secondary | ICD-10-CM | POA: Diagnosis not present

## 2016-06-03 DIAGNOSIS — H02842 Edema of right lower eyelid: Secondary | ICD-10-CM | POA: Diagnosis not present

## 2016-06-10 ENCOUNTER — Ambulatory Visit (INDEPENDENT_AMBULATORY_CARE_PROVIDER_SITE_OTHER): Payer: Medicare Other | Admitting: Neurology

## 2016-06-10 DIAGNOSIS — G4720 Circadian rhythm sleep disorder, unspecified type: Secondary | ICD-10-CM

## 2016-06-10 DIAGNOSIS — G4761 Periodic limb movement disorder: Secondary | ICD-10-CM

## 2016-06-10 DIAGNOSIS — G479 Sleep disorder, unspecified: Secondary | ICD-10-CM

## 2016-06-10 DIAGNOSIS — L821 Other seborrheic keratosis: Secondary | ICD-10-CM | POA: Diagnosis not present

## 2016-06-10 DIAGNOSIS — G4733 Obstructive sleep apnea (adult) (pediatric): Secondary | ICD-10-CM | POA: Diagnosis not present

## 2016-06-10 DIAGNOSIS — L570 Actinic keratosis: Secondary | ICD-10-CM | POA: Diagnosis not present

## 2016-06-13 ENCOUNTER — Telehealth: Payer: Self-pay | Admitting: Neurology

## 2016-06-13 DIAGNOSIS — G4733 Obstructive sleep apnea (adult) (pediatric): Secondary | ICD-10-CM

## 2016-06-13 NOTE — Telephone Encounter (Signed)
Patient referred by Dr. Philip Aspen, seen by me on 05/21/16, diagnostic PSG on 06/10/16, ins: MCR.   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 This will require a repeat sleep study for proper titration and mask fitting. Please explain to patient and arrange for a CPAP titration study. I have placed an order in the chart. Thanks, and please route to [facility] for scheduling next sleep study.  Star Age, MD, PhD [facility])

## 2016-06-13 NOTE — Telephone Encounter (Signed)
Patient returned Nathan Delgado's call, please call [phone].

## 2016-06-13 NOTE — Telephone Encounter (Signed)
LM for patient to call back.

## 2016-06-13 NOTE — Telephone Encounter (Signed)
I spoke to patient and went over the details of sleep study with him. He voiced understanding. Patient states that he wants to think about doing the second study first and will call us back with his decision.

## 2016-06-13 NOTE — Telephone Encounter (Signed)
Patient called back, requests to speak to [facility] regarding sleep study. Please call [phone].

## 2016-06-13 NOTE — Telephone Encounter (Signed)
I called patient back. He just need clarification of results. Patient will discuss results with PCP and call us back to let us know if he wants to do titration study.

## 2016-07-01 ENCOUNTER — Ambulatory Visit (INDEPENDENT_AMBULATORY_CARE_PROVIDER_SITE_OTHER): Payer: Medicare Other | Admitting: Neurology

## 2016-07-01 DIAGNOSIS — G4733 Obstructive sleep apnea (adult) (pediatric): Secondary | ICD-10-CM | POA: Diagnosis not present

## 2016-07-01 DIAGNOSIS — G4720 Circadian rhythm sleep disorder, unspecified type: Secondary | ICD-10-CM

## 2016-07-01 DIAGNOSIS — G479 Sleep disorder, unspecified: Secondary | ICD-10-CM

## 2016-07-01 DIAGNOSIS — G4761 Periodic limb movement disorder: Secondary | ICD-10-CM

## 2016-07-04 ENCOUNTER — Telehealth: Payer: Self-pay | Admitting: Neurology

## 2016-07-04 DIAGNOSIS — G4733 Obstructive sleep apnea (adult) (pediatric): Secondary | ICD-10-CM

## 2016-07-09 NOTE — Telephone Encounter (Signed)
I spoke to patient and he is aware of results and recommendations. He is willing to proceed with treatment. I will send orders to [facility] per patient. I will send report to PCP. I will also send a letter to the patient reminding him to make f/u appt and stress the importance of compliance.

## 2016-07-09 NOTE — Telephone Encounter (Signed)
Patient referred by Dr. Philip Aspen, seen by me on 05/21/16, diagnostic PSG on 06/10/16, cpap study on 07/01/16, ins: MCR.   Please call and inform patient that I have entered an order for treatment with positive airway pressure (PAP) treatment of obstructive sleep apnea (OSA). He did well during the latest sleep study with CPAP. We will, therefore, arrange for a machine for home use through a DME (durable medical equipment) company of His choice; and I will see the patient back in follow-up in about 8-10 weeks. Please also explain to the patient that I will be looking out for compliance data, which can be downloaded from the machine (stored on an SD card, that is inserted in the machine) or via remote access through a modem, that is built into the machine. At the time of the followup appointment we will discuss sleep study results and how it is going with PAP treatment at home. Please advise patient to bring His machine at the time of the first FU visit, even though this is cumbersome. Bringing the machine for every visit after that will likely not be needed, but often helps for the first visit to troubleshoot if needed. Please re-enforce the importance of compliance with treatment and the need for [place] to monitor compliance data - often an insurance requirement and actually good feedback for the patient as far as how they are doing.  Also remind patient, that any interim PAP machine or mask issues should be first addressed with the DME company, as they can often help better with technical and mask fit issues. Please ask if patient has a preference regarding DME company.  Please also make sure, the patient has a follow-up appointment with me in about 8-10 weeks from the setup date, thanks.  Once you have spoken to the patient - and faxed/routed report to PCP and referring MD (if other than PCP), you can close this encounter, thanks,   Star Age, MD, PhD [facility] ([place])

## 2016-09-22 ENCOUNTER — Encounter: Payer: Self-pay | Admitting: Neurology

## 2016-09-24 ENCOUNTER — Ambulatory Visit (INDEPENDENT_AMBULATORY_CARE_PROVIDER_SITE_OTHER): Payer: Medicare Other | Admitting: Neurology

## 2016-09-24 ENCOUNTER — Encounter: Payer: Self-pay | Admitting: Neurology

## 2016-09-24 VITALS — BP 114/69 | HR 65 | Resp 18 | Ht 69.5 in | Wt 228.0 lb

## 2016-09-24 DIAGNOSIS — G4733 Obstructive sleep apnea (adult) (pediatric): Secondary | ICD-10-CM

## 2016-09-24 DIAGNOSIS — Z9989 Dependence on other enabling machines and devices: Secondary | ICD-10-CM

## 2016-09-24 NOTE — Patient Instructions (Signed)

## 2016-09-24 NOTE — Progress Notes (Signed)
Subjective:    Patient ID: Nathan Delgado is a 75 y.o. male.  HPI    Interim history:  Nathan Delgado is a 75 year old right-handed gentleman with an underlying medical history of allergic rhinitis, hypertension, fibromyalgia, reflux disease, ED, anxiety and obesity, who presents for follow-up consultation of his sleep disorder, after his recent sleep studies. The patient is unaccompanied today. I first met him on 05/21/2016 at the request of his primary care physician, at which time Nathan Delgado reported nonrestorative sleep, sleep disruption, and nocturia. I invited him for a sleep study. Nathan Delgado had a baseline sleep study followed by a CPAP titration study. I went over his test results with him in detail today. His baseline sleep study on 06/10/2016 showed a sleep efficiency of 78.4% with a latency to sleep of 17 minutes and wake after sleep onset of 83 minutes with moderate sleep fragmentation noted. Nathan Delgado had an elevated arousal index. Nathan Delgado had absence of slow-wave sleep and REM sleep was 40.5% with a normal REM latency. Nathan Delgado had PLMS with an index of 25.5 per hour, resulting in only 2.7 arousals per hour. Nathan Delgado had mild to moderate snoring. Supine REM sleep was not achieved. Total AHI was 12.6 per hour, rising to 22.9 per hour during REM sleep. Average oxygen saturation was 92%, nadir was 81%. Time below 90% saturation was 47 minutes. Based on his test results and his sleep related complaints I invited him for a full night CPAP titration study. Nathan Delgado had this on 07/01/2016. Sleep efficiency was 83.4%, sleep latency 13.5 minutes, wake after sleep onset was 51 minutes with mild to moderate sleep fragmentation noted. Nathan Delgado had a high normal arousal index. Nathan Delgado had absence of slow-wave sleep, REM sleep was 18.9% with a REM latency of 69 minutes. Nathan Delgado had very mild PLMS with an index of 12.4 per hour, resulting in 3.5 arousals per hour. CPAP was started at 5 cm using a nasal mask. CPAP was titrated to 13 cm. AHI was 1.4 per hour at a pressure  of 10 cm. Brief supine REM sleep was achieved. Based on his test results are prescribed CPAP therapy for home use.  Today, 09/24/2016: I reviewed his CPAP compliance data from 08/24/2016 through 09/22/2016 which is a total of 30 days, during which time Nathan Delgado used his machine every night with percent used days greater than 4 hours at 100%, indicating superb compliance with an average usage of 8 hours and 20 minutes, residual AHI 1.4 per hour, leak low on the higher end of normal with the 95th percentile at 21.6 L per min and a pressure of 10 cm with EPR of 3.  Today, 09/24/2016: Nathan Delgado reports doing fairly well, has adjusted to treatment quite well, no telltale or day and night response but feels that Nathan Delgado is doing reasonably well, nocturia may be about the same, sleep disruption may be about the same. Is motivated to continue with treatment. Had a recent change in his blood pressure medication and numbers are better. Nathan Delgado denies any restless leg symptoms, recalls that Nathan Delgado had some discomfort with his hips during the sleep studies and sometimes Nathan Delgado has problems with hip pain at night, is not aware of any leg twitching while asleep.  Previously:  05/21/2016: Nathan Delgado reports sleep disruption, nocturia and non-restorative sleep. Nathan Delgado does not know if Nathan Delgado snores. Nathan Delgado is divorced (x2) and lives alone. Nathan Delgado reports difficulty initiating and maintaining sleep. I reviewed your office note from 05/06/2016, which you kindly included.  Nathan Delgado goes to the gym  and works with a Clinical research associate, M/W/F. Nathan Delgado has 2 grown children, 4 grandchildren. Nathan Delgado is retired. Nathan Delgado quit smoking in 1983. Nathan Delgado drinks about 2 alcoholic beverages per day, coffee 1-2 cups per day. Nathan Delgado goes to bed around 11 PM and watches TV, puts it on a timer. Usually turns the TV off before the time and turns it off. Nathan Delgado falls asleep fairly quickly. Until about a month ago Nathan Delgado had significant sleep disruption, particularly from having to go to the bathroom 3-4 times on an average night. Since his recent  medication changes Nathan Delgado has had improvement in his sleep disruption and nocturia and gets up on average once per night. His wake up time is around 7:30 to 8 AM. Nathan Delgado feels adequately or reasonably rested when Nathan Delgado first wakes up. Nathan Delgado denies restless leg symptoms or morning headaches. Nathan Delgado does not take any naps. Sometimes when Nathan Delgado comes home after working out with a trainer Nathan Delgado doses off briefly. His recent medication changes include discontinuation of amlodipine and Lyrica, reduction in Wellbutrin, new blood pressure medication and Nathan Delgado was then restarted on low-dose Lyrica, currently at 50 mg twice daily with good tolerance. Nathan Delgado had some intermittent tingling which have improved. Nathan Delgado has some symptoms in keeping with a carpal tunnel syndrome. His weight has been stable for the past 5-7 years. Epworth Sleepiness Scale score is 2 out of 24 today, his fatigue score is 11 out of 63.     His Past Medical History Is Significant For: Past Medical History:  Diagnosis Date  . Allergic rhinitis   . ED (erectile dysfunction)   . Fibromyalgia   . GERD (gastroesophageal reflux disease)   . Hypertension   . Lateral epicondylitis   . Paronychia     His Past Surgical History Is Significant For: Past Surgical History:  Procedure Laterality Date  . CATARACT EXTRACTION, BILATERAL    . INGUINAL HERNIA REPAIR    . KNEE ARTHROSCOPY    . TONSILLECTOMY      His Family History Is Significant For: Family History  Problem Relation Age of Onset  . Cancer Mother   . Dementia Father   . Depression Father   . Macular degeneration Father   . Neuropathy Neg Hx     His Social History Is Significant For: Social History   Social History  . Marital status: Divorced    Spouse name: N/A  . Number of children: 2  . Years of education: College   Occupational History  . Retired     Social History Main Topics  . Smoking status: Former Research scientist (life sciences)  . Smokeless tobacco: None  . Alcohol use 0.0 oz/week     Comment: 2 drinks per  day  . Drug use: No  . Sexual activity: Not Asked   Other Topics Concern  . None   Social History Narrative   Lives at home    Caffeine use: 2 cup coffee per day     His Allergies Are:  No Known Allergies:   His Current Medications Are:  Outpatient Encounter Prescriptions as of 09/24/2016  Medication Sig  . ALPRAZolam (XANAX) 0.25 MG tablet   . aspirin 81 MG chewable tablet Chew by mouth daily.  Marland Kitchen buPROPion (WELLBUTRIN SR) 150 MG 12 hr tablet   . Fish Oil-Cholecalciferol (OMEGA-3 + VITAMIN D3 PO) Take by mouth.  . losartan-hydrochlorothiazide (HYZAAR) 100-12.5 MG tablet   . Magnesium 250 MG TABS Take by mouth.  . Multiple Vitamin (MULTIVITAMIN) tablet Take 1 tablet by mouth daily.  Marland Kitchen  omeprazole (PRILOSEC) 20 MG capsule Take 20 mg by mouth every other day.   . [DISCONTINUED] pregabalin (LYRICA) 50 MG capsule Take 1 capsule (50 mg total) by mouth 2 (two) times daily.   No facility-administered encounter medications on file as of 09/24/2016.   :  Review of Systems:  Out of a complete 14 point review of systems, all are reviewed and negative with the exception of these symptoms as listed below:   Review of Systems  Neurological:       Patient feels that Nathan Delgado is doing ok on CPAP. States that Nathan Delgado wakes up many times during the night.     Objective:  Neurologic Exam  Physical Exam Physical Examination:   Vitals:   09/24/16 1406  BP: 114/69  Pulse: 65  Resp: 18    General Examination: The patient is a very pleasant 75 y.o. male in no acute distress. Nathan Delgado appears well-developed and well-nourished and well groomed. Good spirits today.  HEENT: Normocephalic, atraumatic, pupils are equal, round and reactive to light and accommodation. Extraocular tracking is good without limitation to gaze excursion or nystagmus noted. Normal smooth pursuit is noted. Hearing is grossly intact. Face is symmetric with normal facial animation and normal facial sensation. Speech is clear with no  dysarthria noted. There is no hypophonia. There is no lip, neck/head, jaw or voice tremor. Neck is supple with full range of passive and active motion. There are no carotid bruits on auscultation. Oropharynx exam reveals: mild mouth dryness, adequate dental hygiene and mild airway crowding, due to redundant soft palate, large uvula. Mallampati is class II. Tongue protrudes centrally and palate elevates symmetrically. Tonsils are absent.   Chest: Clear to auscultation without wheezing, rhonchi or crackles noted.  Heart: S1+S2+0, regular and normal without murmurs, rubs or gallops noted.   Abdomen: Soft, non-tender and non-distended with normal bowel sounds appreciated on auscultation.  Extremities: There is no pitting edema in the distal lower extremities bilaterally. Pedal pulses are intact.  Skin: Warm and dry without trophic changes noted. There are no varicose veins.  Musculoskeletal: exam reveals no obvious joint deformities, tenderness or joint swelling or erythema.   Neurologically:  Mental status: The patient is awake, alert and oriented in all 4 spheres. His immediate and remote memory, attention, language skills and fund of knowledge are appropriate. There is no evidence of aphasia, agnosia, apraxia or anomia. Speech is clear with normal prosody and enunciation. Thought process is linear. Mood is normal and affect is normal.  Cranial nerves II - XII are as described above under HEENT exam. In addition: shoulder shrug is normal with equal shoulder height noted. Motor exam: Normal bulk, strength and tone is noted. There is no drift, tremor or rebound. Romberg is negative, except for mild sway. Reflexes are 1+ throughout. Fine motor skills and coordination: intact with normal finger taps, normal hand movements, normal rapid alternating patting, normal foot taps and normal foot agility.  Cerebellar testing: No dysmetria or intention tremor on finger to nose testing. Heel to shin is  unremarkable bilaterally. Sensory exam: intact in the upper and lower extremities.  Gait, station and balance: Nathan Delgado stands easily. No veering to one side is noted. No leaning to one side is noted. Posture is age-appropriate and stance is narrow based. Gait shows normal stride length and normal pace. No problems turning are noted. Tandem walk is slightly challenging for him, unchanged.  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 Nathan Delgado had a baseline sleep study in July 2017, followed by a CPAP titration study in August 2017. We talked about his test results in detail today. Nathan Delgado has been fully compliant with CPAP therapy and is commended for this. Nathan Delgado feels that Nathan Delgado can continue with treatment, physical exam and neurological exam are stable. Nathan Delgado has no symptoms of restless leg syndrome and is not aware of any leg twitching during sleep but does have intermittent hip discomfort. We reviewed his compliance numbers. Nathan Delgado can continue with the current settings and the nasal pillows. Nathan Delgado will need new supplies including filters as Nathan Delgado has not received any new filter and it looks like it needs replacement. From my end of things Nathan Delgado has done well. I suggested Nathan Delgado return in 6 months for a routine recheck and yearly thereafter if Nathan Delgado continues to do well. I answered all his questions today and Nathan Delgado was in agreement.  I spent 25 minutes in total face-to-face time with the patient, more than 50% of which was spent in counseling and coordination of care, reviewing test results, reviewing medication and discussing or reviewing the diagnosis of OSA, its prognosis and treatment options.

## 2016-10-10 ENCOUNTER — Ambulatory Visit (INDEPENDENT_AMBULATORY_CARE_PROVIDER_SITE_OTHER): Payer: Medicare Other

## 2016-10-10 ENCOUNTER — Encounter (INDEPENDENT_AMBULATORY_CARE_PROVIDER_SITE_OTHER): Payer: Self-pay | Admitting: Orthopaedic Surgery

## 2016-10-10 ENCOUNTER — Ambulatory Visit (INDEPENDENT_AMBULATORY_CARE_PROVIDER_SITE_OTHER): Payer: Medicare Other | Admitting: Orthopaedic Surgery

## 2016-10-10 VITALS — BP 110/80 | HR 68 | Resp 14 | Ht 70.0 in | Wt 220.0 lb

## 2016-10-10 DIAGNOSIS — M25511 Pain in right shoulder: Secondary | ICD-10-CM

## 2016-10-10 DIAGNOSIS — G8929 Other chronic pain: Secondary | ICD-10-CM

## 2016-10-10 DIAGNOSIS — M7541 Impingement syndrome of right shoulder: Secondary | ICD-10-CM

## 2016-10-10 NOTE — Progress Notes (Signed)
Office Visit Note   Patient: Nathan Delgado           Date of Birth: [birth year]           MRN: [id number] Visit Date: 10/10/2016              Requested by: Leanna Battles, MD [address] [place], [place] [address] PCP: Donnajean Lopes, MD   Assessment & Plan: Visit Diagnoses:  1. Chronic right shoulder pain     Plan:  #1 corticosteroid injection subacromially to the right shoulder #2 if limited improvement or no improvement he will call we can do an MRI scan otherwise follow up when necessary  Follow-Up Instructions: Return if symptoms worsen or fail to improve.   Orders:  Orders Placed This Encounter  Procedures  . Large Joint Injection/Arthrocentesis  . XR Shoulder Right   No orders of the defined types were placed in this encounter.     Procedures: Large Joint Inj Date/Time: 10/10/2016 3:50 PM Performed by: Biagio Borg D Authorized by: Biagio Borg D   Consent Given by:  Patient Timeout: prior to procedure the correct patient, procedure, and site was verified   Indications:  Pain Location:  Shoulder Site:  L subacromial bursa Prep: patient was prepped and draped in usual sterile fashion   Needle Size:  25 G Needle Length:  1.5 inches Approach:  Lateral Ultrasound Guidance: No   Fluoroscopic Guidance: No   Arthrogram: No   Medications:  80 mg methylPREDNISolone acetate 40 MG/ML; 2 mL lidocaine 1 %; 2 mL bupivacaine 0.5 % Aspiration Attempted: No   Patient tolerance:  Patient tolerated the procedure well with no immediate complications  Minimal empty can test post injection.     Clinical Data: No additional findings.   Subjective: Painful right shoulder  Is very pleasant 75 year old white male who is seen today for evaluation of his right shoulder. He states that his pain today in the right shoulder is very similar to his left shoulder pain that he had previously that was markedly improved by corticosteroid injection.  He has been going  to a trainer 3 x week for 7 years. He does not raise his arm above his head. He tends to do most of his workout below shoulder level in the upper extremities. He states his pain starts in the shoulder and radiates down and stops just above the elbow. Compared to the left shoulder which was down into his forearm. He does have some problems with overhead activity. Has denied radicular type symptoms otherwise. Pt uses CPAP machine    Review of Systems  Constitutional: Negative.   HENT: Negative.   Eyes: Negative.   Respiratory: Positive for apnea.        CPAP  Cardiovascular: Negative.   Gastrointestinal: Negative.   Endocrine: Negative.   Genitourinary: Negative.   Musculoskeletal: Negative.   Skin: Negative.   Allergic/Immunologic: Negative.   Neurological: Negative.   Hematological: Negative.   Psychiatric/Behavioral: Negative.      Objective: Vital Signs: BP 110/80 (BP Location: Left Arm)   Pulse 68   Resp 14   Ht 5\' 10"  (1.778 m)   Wt 220 lb (99.8 kg)   BMI 31.57 kg/m   Physical Exam  Constitutional: He is oriented to person, place, and time. He appears well-developed and well-nourished.  HENT:  Head: Normocephalic and atraumatic.  Eyes: EOM are normal. Pupils are equal, round, and reactive to light.  Neck:  No carotid bruits  Pulmonary/Chest: Effort normal.  Neurological: He is alert and oriented to person, place, and time.  Skin: Skin is warm and dry.  Psychiatric: He has a normal mood and affect. His behavior is normal. Judgment and thought content normal.    Left Shoulder Exam   Range of Motion  Active Abduction: 90  Passive Abduction: 110  Forward Flexion: 150  External Rotation: 70  Internal Rotation 90 degrees: 30   Muscle Strength  Abduction: 4/5  Internal Rotation: 4/5  External Rotation: 4/5  Biceps: 4/5   Tests  Impingement: positive  Other  Sensation: normal Pulse: present       Specialty Comments:  No specialty comments  available.  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 Glenohumeral joint inferior spurring noted. Small cyst at the greater tuberosity. 2 acromion.    PMFS History: Patient Active Problem List   Diagnosis Date Noted  . 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 Fibromyalgia 04/23/2015   Past Medical History:  Diagnosis Date  . Allergic rhinitis   . ED (erectile dysfunction)   . Fibromyalgia   . GERD (gastroesophageal reflux disease)   . Hypertension   . Lateral epicondylitis   . Paronychia     Family History  Problem Relation Age of Onset  . Cancer Mother   . Dementia Father   . Depression Father   . Macular degeneration Father   . Neuropathy Neg Hx     Past Surgical History:  Procedure Laterality Date  . CATARACT EXTRACTION, BILATERAL    . INGUINAL HERNIA REPAIR    . KNEE ARTHROSCOPY    . TONSILLECTOMY     Social History   Occupational History  . Retired     Social History Main Topics  . Smoking status: Former Research scientist (life sciences)  . Smokeless tobacco: Not on file  . Alcohol use 0.0 oz/week     Comment: 2 drinks per day  . Drug use: No  . Sexual activity: Not on file

## 2016-10-14 MED ORDER — LIDOCAINE HCL 1 % IJ SOLN
2.0000 mL | INTRAMUSCULAR | Status: AC | PRN
  Administered 2016-10-10: 2 mL

## 2016-10-14 MED ORDER — BUPIVACAINE HCL 0.5 % IJ SOLN
2.0000 mL | INTRAMUSCULAR | Status: AC | PRN
  Administered 2016-10-10: 2 mL via INTRA_ARTICULAR

## 2016-10-14 MED ORDER — METHYLPREDNISOLONE ACETATE 40 MG/ML IJ SUSP
80.0000 mg | INTRAMUSCULAR | Status: AC | PRN
  Administered 2016-10-10: 80 mg

## 2016-10-20 ENCOUNTER — Encounter (HOSPITAL_COMMUNITY): Payer: Self-pay | Admitting: Emergency Medicine

## 2016-10-20 ENCOUNTER — Inpatient Hospital Stay (HOSPITAL_COMMUNITY)
Admission: EM | Admit: 2016-10-20 | Discharge: 2016-10-23 | DRG: 342 | Disposition: A | Discharge status: Home / Self Care | Payer: Medicare Other | Attending: General Surgery | Admitting: General Surgery

## 2016-10-20 ENCOUNTER — Emergency Department (HOSPITAL_COMMUNITY): Payer: Medicare Other | Admitting: Anesthesiology

## 2016-10-20 ENCOUNTER — Encounter (HOSPITAL_COMMUNITY): Admission: EM | Disposition: A | Discharge status: Home / Self Care | Payer: Self-pay | Source: Home / Self Care

## 2016-10-20 ENCOUNTER — Emergency Department (HOSPITAL_COMMUNITY): Payer: Medicare Other

## 2016-10-20 DIAGNOSIS — Z6831 Body mass index (BMI) 31.0-31.9, adult: Secondary | ICD-10-CM

## 2016-10-20 DIAGNOSIS — K5730 Diverticulosis of large intestine without perforation or abscess without bleeding: Secondary | ICD-10-CM | POA: Diagnosis not present

## 2016-10-20 DIAGNOSIS — K5909 Other constipation: Secondary | ICD-10-CM | POA: Diagnosis present

## 2016-10-20 DIAGNOSIS — G4733 Obstructive sleep apnea (adult) (pediatric): Secondary | ICD-10-CM | POA: Diagnosis not present

## 2016-10-20 DIAGNOSIS — J9382 Other air leak: Secondary | ICD-10-CM | POA: Diagnosis not present

## 2016-10-20 DIAGNOSIS — Z87891 Personal history of nicotine dependence: Secondary | ICD-10-CM

## 2016-10-20 DIAGNOSIS — K219 Gastro-esophageal reflux disease without esophagitis: Secondary | ICD-10-CM | POA: Diagnosis not present

## 2016-10-20 DIAGNOSIS — Z809 Family history of malignant neoplasm, unspecified: Secondary | ICD-10-CM | POA: Diagnosis not present

## 2016-10-20 DIAGNOSIS — R109 Unspecified abdominal pain: Secondary | ICD-10-CM | POA: Diagnosis not present

## 2016-10-20 DIAGNOSIS — R1084 Generalized abdominal pain: Secondary | ICD-10-CM | POA: Diagnosis not present

## 2016-10-20 DIAGNOSIS — K3580 Unspecified acute appendicitis: Secondary | ICD-10-CM | POA: Diagnosis not present

## 2016-10-20 DIAGNOSIS — K760 Fatty (change of) liver, not elsewhere classified: Secondary | ICD-10-CM | POA: Diagnosis present

## 2016-10-20 DIAGNOSIS — Z818 Family history of other mental and behavioral disorders: Secondary | ICD-10-CM | POA: Diagnosis not present

## 2016-10-20 DIAGNOSIS — I9581 Postprocedural hypotension: Secondary | ICD-10-CM | POA: Diagnosis not present

## 2016-10-20 DIAGNOSIS — G5710 Meralgia paresthetica, unspecified lower limb: Secondary | ICD-10-CM | POA: Diagnosis not present

## 2016-10-20 DIAGNOSIS — Z7982 Long term (current) use of aspirin: Secondary | ICD-10-CM

## 2016-10-20 DIAGNOSIS — M797 Fibromyalgia: Secondary | ICD-10-CM | POA: Diagnosis not present

## 2016-10-20 DIAGNOSIS — Z79899 Other long term (current) drug therapy: Secondary | ICD-10-CM

## 2016-10-20 DIAGNOSIS — Z9049 Acquired absence of other specified parts of digestive tract: Secondary | ICD-10-CM

## 2016-10-20 DIAGNOSIS — I10 Essential (primary) hypertension: Secondary | ICD-10-CM | POA: Diagnosis not present

## 2016-10-20 HISTORY — DX: Unspecified acute appendicitis: K35.80

## 2016-10-20 HISTORY — PX: LAPAROSCOPIC APPENDECTOMY: SHX408

## 2016-10-20 HISTORY — DX: Failed or difficult intubation, initial encounter: T88.4XXA

## 2016-10-20 LAB — URINALYSIS, ROUTINE W REFLEX MICROSCOPIC
BILIRUBIN URINE: NEGATIVE
Glucose, UA: NEGATIVE mg/dL
Hgb urine dipstick: NEGATIVE
KETONES UR: 40 mg/dL — AB
Leukocytes, UA: NEGATIVE
NITRITE: NEGATIVE
Protein, ur: NEGATIVE mg/dL
Specific Gravity, Urine: >1.046 — ABNORMAL HIGH (ref 1.005–1.030)
pH: 5.5 (ref 5.0–8.0)

## 2016-10-20 LAB — CBC WITH DIFFERENTIAL/PLATELET
BASOS PCT: 0 %
Basophils Absolute: 0 10*3/uL (ref 0.0–0.1)
EOS ABS: 0 10*3/uL (ref 0.0–0.7)
EOS PCT: 0 %
HCT: 45.5 % (ref 39.0–52.0)
Hemoglobin: 16.3 g/dL (ref 13.0–17.0)
LYMPHS ABS: 0.2 10*3/uL — AB (ref 0.7–4.0)
Lymphocytes Relative: 3 %
MCH: 31.5 pg (ref 26.0–34.0)
MCHC: 35.8 g/dL (ref 30.0–36.0)
MCV: 87.8 fL (ref 78.0–100.0)
MONOS PCT: 0 %
Monocytes Absolute: 0 10*3/uL — ABNORMAL LOW (ref 0.1–1.0)
Neutro Abs: 6.8 10*3/uL (ref 1.7–7.7)
Neutrophils Relative %: 97 %
PLATELETS: 149 10*3/uL — AB (ref 150–400)
RBC: 5.18 MIL/uL (ref 4.22–5.81)
RDW: 14 % (ref 11.5–15.5)
WBC: 7 10*3/uL (ref 4.0–10.5)

## 2016-10-20 LAB — COMPREHENSIVE METABOLIC PANEL
ALBUMIN: 3.9 g/dL (ref 3.5–5.0)
ALT: 44 U/L (ref 17–63)
ANION GAP: 9 (ref 5–15)
AST: 37 U/L (ref 15–41)
Alkaline Phosphatase: 71 U/L (ref 38–126)
BUN: 21 mg/dL — ABNORMAL HIGH (ref 6–20)
CHLORIDE: 103 mmol/L (ref 101–111)
CO2: 24 mmol/L (ref 22–32)
Calcium: 8.9 mg/dL (ref 8.9–10.3)
Creatinine, Ser: 1.18 mg/dL (ref 0.61–1.24)
GFR calc non Af Amer: 59 mL/min — ABNORMAL LOW (ref >60)
Glucose, Bld: 104 mg/dL — ABNORMAL HIGH (ref 65–99)
Potassium: 3.4 mmol/L — ABNORMAL LOW (ref 3.5–5.1)
SODIUM: 136 mmol/L (ref 135–145)
Total Bilirubin: 2 mg/dL — ABNORMAL HIGH (ref 0.3–1.2)
Total Protein: 7 g/dL (ref 6.5–8.1)

## 2016-10-20 LAB — LIPASE, BLOOD: LIPASE: 23 U/L (ref 11–51)

## 2016-10-20 SURGERY — APPENDECTOMY, LAPAROSCOPIC
Anesthesia: General | Site: Abdomen

## 2016-10-20 MED ORDER — HYDROMORPHONE HCL 1 MG/ML IJ SOLN
0.5000 mg | Freq: Once | INTRAMUSCULAR | Status: AC
  Administered 2016-10-20: 0.5 mg via INTRAVENOUS
  Filled 2016-10-20: qty 1

## 2016-10-20 MED ORDER — SUCCINYLCHOLINE CHLORIDE 200 MG/10ML IV SOSY
PREFILLED_SYRINGE | INTRAVENOUS | Status: DC | PRN
  Administered 2016-10-20: 140 mg via INTRAVENOUS

## 2016-10-20 MED ORDER — ONDANSETRON HCL 4 MG/2ML IJ SOLN
INTRAMUSCULAR | Status: AC
  Filled 2016-10-20: qty 2

## 2016-10-20 MED ORDER — FENTANYL CITRATE (PF) 100 MCG/2ML IJ SOLN
INTRAMUSCULAR | Status: DC | PRN
  Administered 2016-10-20: 150 ug via INTRAVENOUS

## 2016-10-20 MED ORDER — ONDANSETRON HCL 4 MG/2ML IJ SOLN: 4.0000 mg | Freq: Four times a day (QID) | INTRAMUSCULAR | Status: DC | PRN

## 2016-10-20 MED ORDER — FENTANYL CITRATE (PF) 250 MCG/5ML IJ SOLN
INTRAMUSCULAR | Status: AC
  Filled 2016-10-20: qty 5

## 2016-10-20 MED ORDER — LIDOCAINE 2% (20 MG/ML) 5 ML SYRINGE
INTRAMUSCULAR | Status: AC
  Filled 2016-10-20: qty 5

## 2016-10-20 MED ORDER — METOCLOPRAMIDE HCL 5 MG/ML IJ SOLN
10.0000 mg | Freq: Once | INTRAMUSCULAR | Status: DC | PRN
Start: 2016-10-20 — End: 2016-10-20

## 2016-10-20 MED ORDER — LOSARTAN POTASSIUM-HCTZ 100-12.5 MG PO TABS: 1.0000 | ORAL_TABLET | Freq: Every day | ORAL | Status: DC

## 2016-10-20 MED ORDER — IOPAMIDOL (ISOVUE-300) INJECTION 61%
INTRAVENOUS | Status: AC
  Filled 2016-10-20: qty 100

## 2016-10-20 MED ORDER — PHENYLEPHRINE 40 MCG/ML (10ML) SYRINGE FOR IV PUSH (FOR BLOOD PRESSURE SUPPORT)
PREFILLED_SYRINGE | INTRAVENOUS | Status: DC | PRN
  Administered 2016-10-20 (×2): 80 ug via INTRAVENOUS
  Administered 2016-10-20 (×2): 120 ug via INTRAVENOUS

## 2016-10-20 MED ORDER — ONDANSETRON 4 MG PO TBDP: 4.0000 mg | ORAL_TABLET | Freq: Four times a day (QID) | ORAL | Status: DC | PRN

## 2016-10-20 MED ORDER — ALPRAZOLAM 0.25 MG PO TABS: 0.2500 mg | ORAL_TABLET | Freq: Two times a day (BID) | ORAL | Status: DC | PRN

## 2016-10-20 MED ORDER — PROPOFOL 10 MG/ML IV BOLUS
INTRAVENOUS | Status: AC
  Filled 2016-10-20: qty 20

## 2016-10-20 MED ORDER — PHENYLEPHRINE 40 MCG/ML (10ML) SYRINGE FOR IV PUSH (FOR BLOOD PRESSURE SUPPORT)
PREFILLED_SYRINGE | INTRAVENOUS | Status: AC
  Filled 2016-10-20: qty 10

## 2016-10-20 MED ORDER — LIDOCAINE 2% (20 MG/ML) 5 ML SYRINGE
INTRAMUSCULAR | Status: DC | PRN
  Administered 2016-10-20: 100 mg via INTRAVENOUS

## 2016-10-20 MED ORDER — ENALAPRILAT 1.25 MG/ML IV SOLN
1.2500 mg | Freq: Four times a day (QID) | INTRAVENOUS | Status: DC | PRN
  Filled 2016-10-20: qty 1

## 2016-10-20 MED ORDER — PANTOPRAZOLE SODIUM 40 MG PO TBEC
40.0000 mg | DELAYED_RELEASE_TABLET | Freq: Every day | ORAL | Status: DC
  Administered 2016-10-20 – 2016-10-23 (×4): 40 mg via ORAL
  Filled 2016-10-20 (×4): qty 1

## 2016-10-20 MED ORDER — BUPIVACAINE HCL (PF) 0.25 % IJ SOLN
INTRAMUSCULAR | Status: DC | PRN
  Administered 2016-10-20: 9 mL

## 2016-10-20 MED ORDER — IOPAMIDOL (ISOVUE-300) INJECTION 61%
100.0000 mL | Freq: Once | INTRAVENOUS | Status: AC | PRN
  Administered 2016-10-20: 100 mL via INTRAVENOUS

## 2016-10-20 MED ORDER — ROCURONIUM BROMIDE 50 MG/5ML IV SOSY
PREFILLED_SYRINGE | INTRAVENOUS | Status: AC
  Filled 2016-10-20: qty 5

## 2016-10-20 MED ORDER — DIPHENHYDRAMINE HCL 50 MG/ML IJ SOLN: 12.5000 mg | Freq: Four times a day (QID) | INTRAMUSCULAR | Status: DC | PRN

## 2016-10-20 MED ORDER — MORPHINE SULFATE (PF) 2 MG/ML IV SOLN: 1.0000 mg | INTRAVENOUS | Status: DC | PRN

## 2016-10-20 MED ORDER — SUGAMMADEX SODIUM 500 MG/5ML IV SOLN
INTRAVENOUS | Status: AC
  Filled 2016-10-20: qty 5

## 2016-10-20 MED ORDER — KETOROLAC TROMETHAMINE 15 MG/ML IJ SOLN
15.0000 mg | Freq: Four times a day (QID) | INTRAMUSCULAR | Status: AC | PRN
  Administered 2016-10-21: 15 mg via INTRAVENOUS
  Filled 2016-10-20: qty 1

## 2016-10-20 MED ORDER — SODIUM CHLORIDE 0.9 % IJ SOLN
INTRAMUSCULAR | Status: AC
  Filled 2016-10-20: qty 50

## 2016-10-20 MED ORDER — SUGAMMADEX SODIUM 200 MG/2ML IV SOLN
INTRAVENOUS | Status: DC | PRN
  Administered 2016-10-20: 300 mg via INTRAVENOUS

## 2016-10-20 MED ORDER — PROPOFOL 10 MG/ML IV BOLUS
INTRAVENOUS | Status: DC | PRN
  Administered 2016-10-20: 50 mg via INTRAVENOUS
  Administered 2016-10-20: 200 mg via INTRAVENOUS

## 2016-10-20 MED ORDER — HYDROCHLOROTHIAZIDE 12.5 MG PO CAPS
12.5000 mg | ORAL_CAPSULE | Freq: Every day | ORAL | Status: DC
  Administered 2016-10-21: 12.5 mg via ORAL
  Filled 2016-10-20: qty 1

## 2016-10-20 MED ORDER — DEXAMETHASONE SODIUM PHOSPHATE 10 MG/ML IJ SOLN
INTRAMUSCULAR | Status: AC
  Filled 2016-10-20: qty 1

## 2016-10-20 MED ORDER — ACETAMINOPHEN 500 MG PO TABS
1000.0000 mg | ORAL_TABLET | Freq: Four times a day (QID) | ORAL | Status: DC
  Administered 2016-10-20 – 2016-10-23 (×10): 1000 mg via ORAL
  Filled 2016-10-20 (×10): qty 2

## 2016-10-20 MED ORDER — DIPHENHYDRAMINE HCL 12.5 MG/5ML PO ELIX: 12.5000 mg | ORAL_SOLUTION | Freq: Four times a day (QID) | ORAL | Status: DC | PRN

## 2016-10-20 MED ORDER — FENTANYL CITRATE (PF) 100 MCG/2ML IJ SOLN: 25.0000 ug | INTRAMUSCULAR | Status: DC | PRN

## 2016-10-20 MED ORDER — 0.9 % SODIUM CHLORIDE (POUR BTL) OPTIME
TOPICAL | Status: DC | PRN
  Administered 2016-10-20: 1000 mL

## 2016-10-20 MED ORDER — BUPIVACAINE HCL (PF) 0.25 % IJ SOLN
INTRAMUSCULAR | Status: AC
  Filled 2016-10-20: qty 30

## 2016-10-20 MED ORDER — LACTATED RINGERS IV SOLN
INTRAVENOUS | Status: DC | PRN
  Administered 2016-10-20 (×2): via INTRAVENOUS

## 2016-10-20 MED ORDER — LACTATED RINGERS IR SOLN
Status: DC | PRN
  Administered 2016-10-20: 3000 mL

## 2016-10-20 MED ORDER — MEPERIDINE HCL 50 MG/ML IJ SOLN: 6.2500 mg | INTRAMUSCULAR | Status: DC | PRN

## 2016-10-20 MED ORDER — ROCURONIUM BROMIDE 10 MG/ML (PF) SYRINGE
PREFILLED_SYRINGE | INTRAVENOUS | Status: DC | PRN
  Administered 2016-10-20: 30 mg via INTRAVENOUS

## 2016-10-20 MED ORDER — KETOROLAC TROMETHAMINE 30 MG/ML IJ SOLN
INTRAMUSCULAR | Status: AC
  Filled 2016-10-20: qty 1

## 2016-10-20 MED ORDER — SODIUM CHLORIDE 0.9 % IV BOLUS (SEPSIS)
1000.0000 mL | Freq: Once | INTRAVENOUS | Status: AC
  Administered 2016-10-20: 1000 mL via INTRAVENOUS

## 2016-10-20 MED ORDER — OXYCODONE HCL 5 MG PO TABS: 5.0000 mg | ORAL_TABLET | ORAL | Status: DC | PRN

## 2016-10-20 MED ORDER — DEXAMETHASONE SODIUM PHOSPHATE 10 MG/ML IJ SOLN
INTRAMUSCULAR | Status: DC | PRN
  Administered 2016-10-20: 10 mg via INTRAVENOUS

## 2016-10-20 MED ORDER — HEPARIN SODIUM (PORCINE) 5000 UNIT/ML IJ SOLN
5000.0000 [IU] | Freq: Three times a day (TID) | INTRAMUSCULAR | Status: DC
  Administered 2016-10-21 – 2016-10-23 (×7): 5000 [IU] via SUBCUTANEOUS
  Filled 2016-10-20 (×7): qty 1

## 2016-10-20 MED ORDER — PIPERACILLIN-TAZOBACTAM 3.375 G IVPB 30 MIN
3.3750 g | Freq: Once | INTRAVENOUS | Status: AC
  Administered 2016-10-20: 3.375 g via INTRAVENOUS
  Filled 2016-10-20: qty 50

## 2016-10-20 MED ORDER — BUPROPION HCL ER (SR) 150 MG PO TB12
150.0000 mg | ORAL_TABLET | Freq: Two times a day (BID) | ORAL | Status: DC
  Administered 2016-10-21 – 2016-10-23 (×3): 150 mg via ORAL
  Filled 2016-10-20 (×4): qty 1

## 2016-10-20 MED ORDER — SUCCINYLCHOLINE CHLORIDE 200 MG/10ML IV SOSY
PREFILLED_SYRINGE | INTRAVENOUS | Status: AC
  Filled 2016-10-20: qty 10

## 2016-10-20 MED ORDER — SIMETHICONE 80 MG PO CHEW: 40.0000 mg | CHEWABLE_TABLET | Freq: Four times a day (QID) | ORAL | Status: DC | PRN

## 2016-10-20 MED ORDER — LOSARTAN POTASSIUM 50 MG PO TABS
100.0000 mg | ORAL_TABLET | Freq: Every day | ORAL | Status: DC
  Administered 2016-10-21: 100 mg via ORAL
  Filled 2016-10-20: qty 2

## 2016-10-20 MED ORDER — KCL IN DEXTROSE-NACL 20-5-0.45 MEQ/L-%-% IV SOLN
INTRAVENOUS | Status: DC
  Administered 2016-10-20: 23:00:00 via INTRAVENOUS
  Filled 2016-10-20 (×2): qty 1000

## 2016-10-20 MED ORDER — PROMETHAZINE HCL 25 MG/ML IJ SOLN: 12.5000 mg | Freq: Four times a day (QID) | INTRAMUSCULAR | Status: DC | PRN

## 2016-10-20 MED ORDER — MORPHINE SULFATE (PF) 10 MG/ML IV SOLN: 1.0000 mg | INTRAVENOUS | Status: DC | PRN

## 2016-10-20 MED ORDER — ONDANSETRON HCL 4 MG/2ML IJ SOLN
INTRAMUSCULAR | Status: DC | PRN
  Administered 2016-10-20: 4 mg via INTRAVENOUS

## 2016-10-20 MED ORDER — PIPERACILLIN-TAZOBACTAM 3.375 G IVPB
3.3750 g | Freq: Three times a day (TID) | INTRAVENOUS | Status: AC
Start: 2016-10-20 — End: 2016-10-21
  Administered 2016-10-20 – 2016-10-21 (×2): 3.375 g via INTRAVENOUS
  Filled 2016-10-20 (×3): qty 50

## 2016-10-20 SURGICAL SUPPLY — 49 items
APPLIER CLIP 5 13 M/L LIGAMAX5 (MISCELLANEOUS)
APPLIER CLIP ROT 10 11.4 M/L (STAPLE)
BENZOIN TINCTURE PRP APPL 2/3 (GAUZE/BANDAGES/DRESSINGS) ×3 IMPLANT
CABLE HIGH FREQUENCY MONO STRZ (ELECTRODE) IMPLANT
CLIP APPLIE 5 13 M/L LIGAMAX5 (MISCELLANEOUS) IMPLANT
CLIP APPLIE ROT 10 11.4 M/L (STAPLE) IMPLANT
CLOSURE WOUND 1/2 X4 (GAUZE/BANDAGES/DRESSINGS) ×1
COVER SURGICAL LIGHT HANDLE (MISCELLANEOUS) IMPLANT
CUTTER FLEX LINEAR 45M (STAPLE) ×3 IMPLANT
DECANTER SPIKE VIAL GLASS SM (MISCELLANEOUS) IMPLANT
DERMABOND ADVANCED (GAUZE/BANDAGES/DRESSINGS)
DERMABOND ADVANCED .7 DNX12 (GAUZE/BANDAGES/DRESSINGS) IMPLANT
DEVICE PMI PUNCTURE CLOSURE (MISCELLANEOUS) ×3 IMPLANT
DRAPE LAPAROSCOPIC ABDOMINAL (DRAPES) ×3 IMPLANT
DRAPE UTILITY XL STRL (DRAPES) ×3 IMPLANT
DRSG TEGADERM 4X4.75 (GAUZE/BANDAGES/DRESSINGS) ×3 IMPLANT
ELECT PENCIL ROCKER SW 15FT (MISCELLANEOUS) ×3 IMPLANT
ELECT REM PT RETURN 9FT ADLT (ELECTROSURGICAL) ×3
ELECTRODE REM PT RTRN 9FT ADLT (ELECTROSURGICAL) ×1 IMPLANT
GAUZE SPONGE 2X2 8PLY STRL LF (GAUZE/BANDAGES/DRESSINGS) ×1 IMPLANT
GAUZE SPONGE 4X4 16PLY XRAY LF (GAUZE/BANDAGES/DRESSINGS) ×3 IMPLANT
GLOVE BIO SURGEON STRL SZ7.5 (GLOVE) ×3 IMPLANT
GLOVE INDICATOR 8.0 STRL GRN (GLOVE) ×3 IMPLANT
GOWN STRL REUS W/TWL LRG LVL3 (GOWN DISPOSABLE) ×3 IMPLANT
GOWN STRL REUS W/TWL XL LVL3 (GOWN DISPOSABLE) ×6 IMPLANT
IRRIG SUCT STRYKERFLOW 2 WTIP (MISCELLANEOUS) ×3
IRRIGATION SUCT STRKRFLW 2 WTP (MISCELLANEOUS) ×1 IMPLANT
IV LACTATED RINGERS 1000ML (IV SOLUTION) ×3 IMPLANT
KIT BASIN OR (CUSTOM PROCEDURE TRAY) ×3 IMPLANT
L-HOOK LAP DISP 36CM (ELECTROSURGICAL) ×3
LHOOK LAP DISP 36CM (ELECTROSURGICAL) ×1 IMPLANT
PACK BASIC (CUSTOM PROCEDURE TRAY) ×3 IMPLANT
POUCH RETRIEVAL ECOSAC 10 (ENDOMECHANICALS) ×1 IMPLANT
POUCH RETRIEVAL ECOSAC 10MM (ENDOMECHANICALS) ×2
POUCH SPECIMEN RETRIEVAL 10MM (ENDOMECHANICALS) IMPLANT
RELOAD 45 VASCULAR/THIN (ENDOMECHANICALS) ×3 IMPLANT
RELOAD STAPLE TA45 3.5 REG BLU (ENDOMECHANICALS) IMPLANT
SHEARS HARMONIC ACE PLUS 36CM (ENDOMECHANICALS) ×3 IMPLANT
SOLUTION ANTI FOG 6CC (MISCELLANEOUS) ×3 IMPLANT
SPONGE GAUZE 2X2 STER 10/PKG (GAUZE/BANDAGES/DRESSINGS) ×2
STRIP CLOSURE SKIN 1/2X4 (GAUZE/BANDAGES/DRESSINGS) ×2 IMPLANT
SUT MNCRL AB 4-0 PS2 18 (SUTURE) ×3 IMPLANT
SUT VICRYL 0 UR6 27IN ABS (SUTURE) ×6 IMPLANT
TOWEL OR 17X26 10 PK STRL BLUE (TOWEL DISPOSABLE) ×6 IMPLANT
TRAY FOLEY W/METER SILVER 16FR (SET/KITS/TRAYS/PACK) ×3 IMPLANT
TRAY LAPAROSCOPIC (CUSTOM PROCEDURE TRAY) IMPLANT
TROCAR XCEL BLUNT TIP 100MML (ENDOMECHANICALS) ×3 IMPLANT
TROCAR XCEL NON-BLD 5MMX100MML (ENDOMECHANICALS) ×6 IMPLANT
TUBING INSUF HEATED (TUBING) ×3 IMPLANT

## 2016-10-20 NOTE — Anesthesia Procedure Notes (Addendum)
Procedure Name: Intubation Date/Time: 10/20/2016 5:12 PM Performed by: Danley Danker L Patient Re-evaluated:Patient Re-evaluated prior to inductionOxygen Delivery Method: Circle system utilized Preoxygenation: Pre-oxygenation with 100% oxygen Intubation Type: IV induction Ventilation: Mask ventilation without difficulty and Oral airway inserted - appropriate to patient size Laryngoscope Size: Glidescope and 3 Grade View: Grade I Tube type: Oral Tube size: 8.0 mm Number of attempts: 3 Airway Equipment and Method: Video-laryngoscopy Placement Confirmation: ETT inserted through vocal cords under direct vision,  positive ETCO2 and breath sounds checked- equal and bilateral Secured at: 22 cm Tube secured with: Tape Dental Injury: Teeth and Oropharynx as per pre-operative assessment  Difficulty Due To: Difficult Airway- due to reduced neck mobility, Difficult Airway- due to anterior larynx and Difficult Airway- due to large tongue Comments: Attempted with Miller 3, Mac 3 and bougie.  Progressed to Glidescope

## 2016-10-20 NOTE — Anesthesia Preprocedure Evaluation (Addendum)
Anesthesia Evaluation  Patient identified by MRN, date of birth, ID band Patient awake    Reviewed: Allergy & Precautions, NPO status , Patient's Chart, lab work & pertinent test results  History of Anesthesia Complications (+) DIFFICULT AIRWAY and history of anesthetic complications  Airway Mallampati: III       Dental no notable dental hx. (+) Teeth Intact   Pulmonary sleep apnea and Continuous Positive Airway Pressure Ventilation , former smoker,    Pulmonary exam normal breath sounds clear to auscultation       Cardiovascular hypertension, Pt. on medications Normal cardiovascular exam Rhythm:Regular Rate:Normal     Neuro/Psych Meralgia paresthetica  Neuromuscular disease negative psych ROS   GI/Hepatic Neg liver ROS, hiatal hernia, GERD  Medicated and Controlled,Acute appendicitis Diverticulosis   Endo/Other  Obesity  Renal/GU negative Renal ROS   ED    Musculoskeletal  (+) Arthritis , Osteoarthritis,  Fibromyalgia -  Abdominal (+) + obese,  Abdomen: tender.    Peds  Hematology negative hematology ROS (+)   Anesthesia Other Findings   Reproductive/Obstetrics                          Lab Results  Component Value Date   WBC 7.0 10/20/2016   HGB 16.3 10/20/2016   HCT 45.5 10/20/2016   MCV 87.8 10/20/2016   PLT 149 (L) 10/20/2016     Chemistry      Component Value Date/Time   NA 136 10/20/2016 1240   K 3.4 (L) 10/20/2016 1240   CL 103 10/20/2016 1240   CO2 24 10/20/2016 1240   BUN 21 (H) 10/20/2016 1240   CREATININE 1.18 10/20/2016 1240      Component Value Date/Time   CALCIUM 8.9 10/20/2016 1240   ALKPHOS 71 10/20/2016 1240   AST 37 10/20/2016 1240   ALT 44 10/20/2016 1240   BILITOT 2.0 (H) 10/20/2016 1240     EKG: normal EKG, normal sinus rhythm.  Anesthesia Physical Anesthesia Plan  ASA: II  Anesthesia Plan: General   Post-op Pain Management:     Induction: Intravenous, Rapid sequence and Cricoid pressure planned  Airway Management Planned: Oral ETT  Additional Equipment:   Intra-op Plan:   Post-operative Plan: Extubation in OR  Informed Consent: I have reviewed the patients History and Physical, chart, labs and discussed the procedure including the risks, benefits and alternatives for the proposed anesthesia with the patient or authorized representative who has indicated his/her understanding and acceptance.   Dental advisory given  Plan Discussed with: Anesthesiologist, CRNA and Surgeon  Anesthesia Plan Comments:         Anesthesia Quick Evaluation

## 2016-10-20 NOTE — Op Note (Signed)
Nathan Delgado [id number] [birth year] 10/20/2016  Appendectomy, Lap, Procedure Note  Indications: The patient presented with a history of right-sided abdominal pain. A CT revealed findings consistent with acute appendicitis.  Pre-operative Diagnosis: Acute appendicitis  Post-operative Diagnosis: Suppurative appendicitis  Surgeon: Gayland Curry   Assistants: none  Anesthesia: General endotracheal anesthesia  Procedure Details  The patient was seen again in the Holding Room. The risks, benefits, complications, treatment options, and expected outcomes were discussed with the patient and/or family. The possibilities of perforation of viscus, bleeding, recurrent infection, the need for additional procedures, failure to diagnose a condition, and creating a complication requiring transfusion or operation were discussed. There was concurrence with the proposed plan and informed consent was obtained. The site of surgery was properly noted. The patient was taken to Operating Room, identified as Nathan Delgado and the procedure verified as Appendectomy. A Time Out was held and the above information confirmed.  The patient was placed in the supine position and general anesthesia was induced, along with placement of orogastric tube, SCDs, and a Foley catheter. The abdomen was prepped and draped in a sterile fashion. A 1.5 centimeter infraumbilical incision was made.  The umbilical stalk was elevated, and the midline fascia was incised with a #11 blade.  A Kelly clamp was used to confirm entrance into the peritoneal cavity.  A pursestring suture was passed around the incision with a 0 Vicryl.  A 64mm Hasson was introduced into the abdomen and the tails of the suture were used to hold the Hasson in place.   The pneumoperitoneum was then established to steady pressure of 15 mmHg.  Additional 5 mm cannulas then placed in the left lower quadrant of the abdomen and the suprapubic region under direct  visualization. A careful evaluation of the entire abdomen was carried out. The patient was placed in Trendelenburg and left lateral decubitus position. The small intestines were retracted in the cephalad and left lateral direction away from the pelvis and right lower quadrant. The patient was found to have an inflammed appendix that was extending into the pelvis. It had fibrinous exudate on it. There was no evidence of perforation.  The appendix was carefully dissected. The appendix was was skeletonized with the harmonic scalpel.   The appendix was divided at its base using an endo-GIA stapler with a white load. No appendiceal stump was left in place. The appendix was removed from the abdomen with an Ecco bag through the umbilical port.  There was no evidence of bleeding, leakage, or complication after division of the appendix. Irrigation was also performed and irrigate suctioned from the abdomen as well.  The umbilical port site was closed with the purse string suture. The closure was viewed laparoscopically. There was no residual palpable fascial defect. However there was an air leak. 2 additional interrupted 0 Vicryl sutures are placed using the PMI suture passer. The trocar site skin wounds were closed with 4-0 Monocryl. Benzoin, Steri-Strips, and bandages were applied to the skin incisions.  Instrument, sponge, and needle counts were correct at the conclusion of the case.   Findings: The appendix was found to be inflamed. There were signs of patchy necrosis in body.  There was not perforation. There was not abscess formation.  Estimated Blood Loss:  Minimal         Drains: none         Specimens: appendix         Complications:  None; patient tolerated the procedure well.  Disposition: PACU - hemodynamically stable.         Condition: stable  Leighton Ruff. Redmond Pulling, MD, FACS General, Bariatric, & Minimally Invasive Surgery [facility], [place]

## 2016-10-20 NOTE — Anesthesia Postprocedure Evaluation (Signed)
Anesthesia Post Note  Patient: Nathan Delgado  Procedure(s) Performed: Procedure(s) (LRB): APPENDECTOMY LAPAROSCOPIC (N/A)  Patient location during evaluation: PACU Anesthesia Type: General Level of consciousness: awake and alert and oriented Pain management: pain level controlled Vital Signs Assessment: post-procedure vital signs reviewed and stable Respiratory status: spontaneous breathing, nonlabored ventilation, respiratory function stable and patient connected to nasal cannula oxygen Cardiovascular status: blood pressure returned to baseline and stable Postop Assessment: no signs of nausea or vomiting Anesthetic complications: no    Last Vitals:  Vitals:   10/20/16 1838 10/20/16 1845  BP: 136/83 139/70  Pulse: 98 95  Resp: 20 19  Temp: 36.7 C     Last Pain:  Vitals:   10/20/16 1334  TempSrc:   PainSc: 2                  Vada Yellen A.

## 2016-10-20 NOTE — ED Triage Notes (Signed)
Pt is from home.  He began having abdominal pain yesterday.  Pt thinks he is constipated.  Pt is congested and achy.  Temp 99.2 oral.  Denies N/V/D.

## 2016-10-20 NOTE — H&P (Signed)
Nathan Delgado is an 75 y.o. male.   Chief Complaint: Abdominal pain HPI: 75 year old obese gentleman developed abdominal pain yesterday afternoon. It was persistent and did not go away. This morning it became more focused on the right side and he developed nausea. He also developed fever and chills. He contacted his son who advised him to call EMS. He was brought to the emergency room. He denies any vomiting, diarrhea or constipation. Workup revealed appendicitis.  He does drink 2 alcoholic beverages per day but denies smoking.  Past Medical History:  Diagnosis Date  . Allergic rhinitis   . ED (erectile dysfunction)   . Fibromyalgia   . GERD (gastroesophageal reflux disease)   . Hypertension   . Lateral epicondylitis   . Paronychia     Past Surgical History:  Procedure Laterality Date  . CATARACT EXTRACTION, BILATERAL    . INGUINAL HERNIA REPAIR    . KNEE ARTHROSCOPY    . TONSILLECTOMY      Family History  Problem Relation Age of Onset  . Cancer Mother   . Dementia Father   . Depression Father   . Macular degeneration Father   . Neuropathy Neg Hx    Social History:  reports that he has quit smoking. He does not have any smokeless tobacco history on file. He reports that he drinks alcohol. He reports that he does not use drugs.  Allergies: No Known Allergies   (Not in a hospital admission)  Results for orders placed or performed during the hospital encounter of 10/20/16 (from the past 48 hour(s))  CBC with Differential     Status: Abnormal   Collection Time: 10/20/16 12:40 PM  Result Value Ref Range   WBC 7.0 4.0 - 10.5 K/uL   RBC 5.18 4.22 - 5.81 MIL/uL   Hemoglobin 16.3 13.0 - 17.0 g/dL   HCT 45.5 39.0 - 52.0 %   MCV 87.8 78.0 - 100.0 fL   MCH 31.5 26.0 - 34.0 pg   MCHC 35.8 30.0 - 36.0 g/dL   RDW 14.0 11.5 - 15.5 %   Platelets 149 (L) 150 - 400 K/uL   Neutrophils Relative % 97 %   Neutro Abs 6.8 1.7 - 7.7 K/uL   Lymphocytes Relative 3 %   Lymphs Abs 0.2 (L)  0.7 - 4.0 K/uL   Monocytes Relative 0 %   Monocytes Absolute 0.0 (L) 0.1 - 1.0 K/uL   Eosinophils Relative 0 %   Eosinophils Absolute 0.0 0.0 - 0.7 K/uL   Basophils Relative 0 %   Basophils Absolute 0.0 0.0 - 0.1 K/uL  Comprehensive metabolic panel     Status: Abnormal   Collection Time: 10/20/16 12:40 PM  Result Value Ref Range   Sodium 136 135 - 145 mmol/L   Potassium 3.4 (L) 3.5 - 5.1 mmol/L   Chloride 103 101 - 111 mmol/L   CO2 24 22 - 32 mmol/L   Glucose, Bld 104 (H) 65 - 99 mg/dL   BUN 21 (H) 6 - 20 mg/dL   Creatinine, Ser 1.18 0.61 - 1.24 mg/dL   Calcium 8.9 8.9 - 10.3 mg/dL   Total Protein 7.0 6.5 - 8.1 g/dL   Albumin 3.9 3.5 - 5.0 g/dL   AST 37 15 - 41 U/L   ALT 44 17 - 63 U/L   Alkaline Phosphatase 71 38 - 126 U/L   Total Bilirubin 2.0 (H) 0.3 - 1.2 mg/dL   GFR calc non Af Amer 59 (L) >60 mL/min  GFR calc Af Amer >60 >60 mL/min    Comment: (NOTE) The eGFR has been calculated using the CKD EPI equation. This calculation has not been validated in all clinical situations. eGFR's persistently <60 mL/min signify possible Chronic Kidney Disease.    Anion gap 9 5 - 15  Lipase, blood     Status: None   Collection Time: 10/20/16 12:40 PM  Result Value Ref Range   Lipase 23 11 - 51 U/L  Urinalysis, Routine w reflex microscopic (not at [facility]     Status: Abnormal   Collection Time: 10/20/16  4:20 PM  Result Value Ref Range   Color, Urine YELLOW YELLOW   APPearance CLEAR CLEAR   Specific Gravity, Urine >1.046 (H) 1.005 - 1.030   pH 5.5 5.0 - 8.0   Glucose, UA NEGATIVE NEGATIVE mg/dL   Hgb urine dipstick NEGATIVE NEGATIVE   Bilirubin Urine NEGATIVE NEGATIVE   Ketones, ur 40 (A) NEGATIVE mg/dL   Protein, ur NEGATIVE NEGATIVE mg/dL   Nitrite NEGATIVE NEGATIVE   Leukocytes, UA NEGATIVE NEGATIVE    Comment: MICROSCOPIC NOT DONE ON URINES WITH NEGATIVE PROTEIN, BLOOD, LEUKOCYTES, NITRITE, OR GLUCOSE <1000 mg/dL.   Ct Abdomen Pelvis W Contrast  Result Date:  10/20/2016 CLINICAL DATA:  Patient with diffuse abdominal pain. EXAM: CT ABDOMEN AND PELVIS WITH CONTRAST TECHNIQUE: Multidetector CT imaging of the abdomen and pelvis was performed using the standard protocol following bolus administration of intravenous contrast. CONTRAST:  113m ISOVUE-300 IOPAMIDOL (ISOVUE-300) INJECTION 61% COMPARISON:  CT abdomen pelvis 06/13/2005. FINDINGS: Lower chest: Normal heart size. Minimal atelectasis within the lower lobes bilaterally. No pleural effusion. Hepatobiliary: Liver is diffusely low in attenuation suggestive of steatosis. No focal lesion identified. Gallbladder is unremarkable. No intrahepatic or extrahepatic biliary ductal dilatation. Pancreas: Unremarkable Spleen: Unremarkable Adrenals/Urinary Tract: Normal adrenal glands. Kidneys enhance symmetrically with contrast. No hydronephrosis. Urinary bladder is unremarkable. 12 mm partially exophytic cyst off the inferior pole of the right kidney. Too small to characterize low-attenuation lesion interpolar region left kidney. Stomach/Bowel: Descending colonic diverticulosis. No CT evidence for acute diverticulitis. The appendix is dilated measuring 12 mm with surrounding fat stranding (image 65; series 2). Small hiatal hernia. Normal morphology of the stomach. Vascular/Lymphatic: Normal caliber abdominal aorta with peripheral calcified atherosclerotic plaque. No retroperitoneal lymphadenopathy. Reproductive: Prostate is unremarkable. Other: None. Musculoskeletal: Lumbar spine degenerative changes. No aggressive or acute appearing osseous lesions. IMPRESSION: Findings compatible with acute non perforated appendicitis. Sigmoid colonic diverticulosis without CT evidence for acute diverticulitis. Aortic atherosclerosis. Hepatic steatosis. These results were called by telephone at the time of interpretation on 10/20/2016 at 2:15 pm to Dr. KTomi Bamberger who verbally acknowledged these results. Electronically Signed   By: DLovey NewcomerM.D.    On: 10/20/2016 14:18    Review of Systems  Constitutional: Negative for weight loss.  HENT: Negative for nosebleeds.   Eyes: Negative for blurred vision.  Respiratory: Negative for shortness of breath.        +osa on cpap  Cardiovascular: Negative for chest pain, palpitations, orthopnea and PND.       Denies DOE  Gastrointestinal: Positive for abdominal pain and nausea. Negative for blood in stool, constipation, diarrhea and vomiting.  Genitourinary: Negative for dysuria and hematuria.       Had urinary retention after inguinal hernia surgery  Musculoskeletal: Negative.   Skin: Negative for itching and rash.  Neurological: Negative for dizziness, focal weakness, seizures, loss of consciousness and headaches.       Denies TIAs, amaurosis fugax  Endo/Heme/Allergies: Does not bruise/bleed easily.  Psychiatric/Behavioral: The patient is not nervous/anxious.     Blood pressure 111/71, pulse 106, temperature 99.2 F (37.3 C), temperature source Oral, resp. rate 16, height _0  (1.778 m), weight 99.8 kg (220 lb), SpO2 98 %. Physical Exam  Vitals reviewed. Constitutional: He is oriented to person, place, and time. He appears well-developed and well-nourished. No distress.  HENT:  Head: Normocephalic and atraumatic.  Right Ear: External ear normal.  Left Ear: External ear normal.  Eyes: Conjunctivae are normal. No scleral icterus.  Neck: Normal range of motion. Neck supple. No tracheal deviation present. No thyromegaly present.  Cardiovascular: Normal rate and normal heart sounds.   Respiratory: Effort normal and breath sounds normal. No stridor. No respiratory distress. He has no wheezes.  GI: Soft. There is tenderness. There is no rebound and no guarding.  Obese, protuberant; RLQ TTP!!  Musculoskeletal: He exhibits no edema or tenderness.  Lymphadenopathy:    He has no cervical adenopathy.  Neurological: He is alert and oriented to person, place, and time. He exhibits normal muscle  tone.  Skin: Skin is warm and dry. No rash noted. He is not diaphoretic. No erythema. No pallor.  Psychiatric: He has a normal mood and affect. His behavior is normal. Judgment and thought content normal.     Assessment/Plan Acute appendicitis osa on cpap HTN Morbid obesity Hepatic steatosis  We discussed the etiology and management of acute appendicitis. We discussed operative and nonoperative management.  I recommended operative management along with IV antibiotics.  We discussed laparoscopic appendectomy. We discussed the risk and benefits of surgery including but not limited to bleeding, infection, injury to surrounding structures, need to convert to an open procedure, blood clot formation, post operative abscess or wound infection, staple line complications such as leak or bleeding, hernia formation, post operative ileus, need for additional procedures, anesthesia complications, and the typical postoperative course. I explained that the patient should expect a good improvement in their symptoms.  Discussed that he was slightly at higher risk for urinary retention  To OR  IV abx scds  Leighton Ruff. Redmond Pulling, MD, [place], Bariatric, & Minimally Invasive Surgery [facility], [place]   Gayland Curry, MD 10/20/2016, 4:42 PM

## 2016-10-20 NOTE — Progress Notes (Signed)
Patient declines the use of nocturnal CPAP tonight. RT will continue to follow.  

## 2016-10-20 NOTE — Transfer of Care (Signed)
Immediate Anesthesia Transfer of Care Note  Patient: Nathan Delgado  Procedure(s) Performed: Procedure(s): APPENDECTOMY LAPAROSCOPIC (N/A)  Patient Location: PACU  Anesthesia Type:General  Level of Consciousness: awake and oriented  Airway & Oxygen Therapy: Patient Spontanous Breathing and Patient connected to face mask oxygen  Post-op Assessment: Report given to RN and Post -op Vital signs reviewed and stable  Post vital signs: Reviewed and stable  Last Vitals:  Vitals:   10/20/16 1500 10/20/16 1621  BP: 92/69 111/71  Pulse: 112 106  Resp: 21 16  Temp:      Last Pain:  Vitals:   10/20/16 1334  TempSrc:   PainSc: 2          Complications: No apparent anesthesia complications

## 2016-10-20 NOTE — ED Notes (Signed)
OR MD transported Pt to OR.

## 2016-10-20 NOTE — ED Provider Notes (Signed)
[facility] DEPT Provider Note   CSN: [id number] Arrival date & time: 10/20/16  1107     History   Chief Complaint Chief Complaint  Patient presents with  . Abdominal Pain    HPI Nathan Delgado is a 75 y.o. male who presents with abdominal pain. Past medical history significant for GERD, hypertension, depression/anxiety. He states that yesterday and the pain started acutely at 2 PM. It was generalized, dull and achy, constant. Severity was 8/10. The pain became better overnight but localized to the right lower quadrant. Pain is currently a 4/10. He reports associated chills, decreased appetite, bilateral hip pain, and nausea (resolved currently). He denies any vomiting. He's also been constipated and had a small, hard bowel movement this morning. He has been taking Ducloax without relief. Denies any melena/hematochezia, or urinary symptoms. Past surgical hx significant for inguinal hernia repair. He has regular colonoscopies and last one was about 5 years ago which was normal. He has never had this pain before.  HPI  Past Medical History:  Diagnosis Date  . Allergic rhinitis   . ED (erectile dysfunction)   . Fibromyalgia   . GERD (gastroesophageal reflux disease)   . Hypertension   . Lateral epicondylitis   . Paronychia     Patient Active Problem List   Diagnosis Date Noted  . 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 Fibromyalgia 04/23/2015    Past Surgical History:  Procedure Laterality Date  . CATARACT EXTRACTION, BILATERAL    . INGUINAL HERNIA REPAIR    . KNEE ARTHROSCOPY    . TONSILLECTOMY         Home Medications    Prior to Admission medications   Medication Sig Start Date End Date Taking? Authorizing Provider  ALPRAZolam Duanne Moron) 0.25 MG tablet  05/13/16   Historical Provider, MD  aspirin 81 MG chewable tablet Chew by mouth daily.    Historical Provider, MD  buPROPion [facility] SR) 150 MG 12 hr tablet  05/06/16   Historical Provider, MD  Fish  Oil-Cholecalciferol (OMEGA-3 + VITAMIN D3 PO) Take by mouth.    Historical Provider, MD  losartan-hydrochlorothiazide Konrad Penta) 100-12.5 MG tablet  04/05/16   Historical Provider, MD  Magnesium 250 MG TABS Take by mouth.    Historical Provider, MD  Multiple Vitamin (MULTIVITAMIN) tablet Take 1 tablet by mouth daily.    Historical Provider, MD  omeprazole (PRILOSEC) 20 MG capsule Take 20 mg by mouth every other day.     Historical Provider, MD    Family History Family History  Problem Relation Age of Onset  . Cancer Mother   . Dementia Father   . Depression Father   . Macular degeneration Father   . Neuropathy Neg Hx     Social History Social History  Substance Use Topics  . Smoking status: Former Research scientist (life sciences)  . Smokeless tobacco: Not on file  . Alcohol use 0.0 oz/week     Comment: 2 drinks per day     Allergies   Patient has no known allergies.   Review of Systems Review of Systems  Constitutional: Positive for appetite change and chills. Negative for fever.  Respiratory: Negative for shortness of breath.   Cardiovascular: Negative for chest pain.  Gastrointestinal: Positive for abdominal pain, constipation and nausea (resolved). Negative for blood in stool, diarrhea and vomiting.  Genitourinary: Negative for dysuria, flank pain and frequency.  Musculoskeletal: Positive for arthralgias.  Skin: Negative for rash.  All other systems reviewed and are negative.    Physical Exam Updated  Vital Signs BP 149/74 (BP Location: Left Arm)   Pulse (!) 125   Temp 99.2 F (37.3 C) (Oral)   Resp 18   Ht 5\' 10"  (1.778 m)   Wt 99.8 kg   SpO2 95%   BMI 31.57 kg/m   Physical Exam  Constitutional: He is oriented to person, place, and time. He appears well-developed and well-nourished. No distress.  NAD. Hard of hearing  HENT:  Head: Normocephalic and atraumatic.  Eyes: Conjunctivae are normal. Pupils are equal, round, and reactive to light. Right eye exhibits no discharge. Left eye  exhibits no discharge. No scleral icterus.  Neck: Normal range of motion.  Cardiovascular: Regular rhythm.  Tachycardia present.  Exam reveals no gallop and no friction rub.   No murmur heard. Pulmonary/Chest: Effort normal and breath sounds normal. No respiratory distress. He has no wheezes. He has no rales. He exhibits no tenderness.  Abdominal: Soft. Bowel sounds are normal. He exhibits no distension and no mass. There is tenderness. There is no rebound and no guarding. No hernia.  RLQ tenderness  Neurological: He is alert and oriented to person, place, and time.  Skin: Skin is warm and dry.  Psychiatric: He has a normal mood and affect. His behavior is normal.  Nursing note and vitals reviewed.    ED Treatments / Results  Labs (all labs ordered are listed, but only abnormal results are displayed) Labs Reviewed  CBC WITH DIFFERENTIAL/PLATELET - Abnormal; Notable for the following:       Result Value   Platelets 149 (*)    Lymphs Abs 0.2 (*)    Monocytes Absolute 0.0 (*)    All other components within normal limits  COMPREHENSIVE METABOLIC PANEL - Abnormal; Notable for the following:    Potassium 3.4 (*)    Glucose, Bld 104 (*)    BUN 21 (*)    Total Bilirubin 2.0 (*)    GFR calc non Af Amer 59 (*)    All other components within normal limits  LIPASE, BLOOD  URINALYSIS, ROUTINE W REFLEX MICROSCOPIC (NOT AT [facility])    EKG  EKG Interpretation None       Radiology No results found.  Procedures Procedures (including critical care time)  Medications Ordered in ED Medications  iopamidol (ISOVUE-300) 61 % injection (not administered)  sodium chloride 0.9 % injection (not administered)  HYDROmorphone (DILAUDID) injection 0.5 mg (0.5 mg Intravenous Given 10/20/16 1237)  iopamidol (ISOVUE-300) 61 % injection 100 mL (100 mLs Intravenous Contrast Given 10/20/16 1329)  piperacillin-tazobactam (ZOSYN) IVPB 3.375 g (0 g Intravenous Stopped 10/20/16 1549)  sodium chloride 0.9 %  bolus 1,000 mL (0 mLs Intravenous Stopped 10/20/16 1620)     Initial Impression / Assessment and Plan / ED Course  I have reviewed the triage vital signs and the nursing notes.  Pertinent labs & imaging results that were available during my care of the patient were reviewed by me and considered in my medical decision making (see chart for details).  75 year old male with acute appendicitis. CT abdomen and pelvis shows appendicitis without evidence of perforation. He is tachycardic but otherwise vitals are WNL. Labs are overall unremarkable. Zosyn and fluids and pain medicine started. Shared visit with Dr. Tomi Bamberger who spoke with Dr. Redmond Pulling with general surgery who will admit. Appreciate assistance.  Final Clinical Impressions(s) / ED Diagnoses   Final diagnoses:  Acute appendicitis, unspecified acute appendicitis type    New Prescriptions New Prescriptions   No medications on file  Recardo Evangelist, PA-C 10/20/16 [phone]

## 2016-10-20 NOTE — ED Provider Notes (Signed)
Medical screening examination/treatment/procedure(s) were conducted as a shared visit with non-physician practitioner(s) and myself.  I personally evaluated the patient during the encounter.  Clinical Course as of Oct 20 1446  [name] Oct 20, 2016  1423 Pt has rlq ttp on exam.  CT scan shows acute appendicitis.   Will give a dose of zosyn.  Consult with general surgery  [JK]  1446 Discussed with Dr Redmond Pulling.  Anticipate surgery later this evening.    [JK]    Clinical Course User Index [JK] Dorie Rank, MD      Dorie Rank, MD 10/20/16 [phone]

## 2016-10-20 NOTE — ED Notes (Signed)
Pt aware of need for urine sample.  

## 2016-10-20 NOTE — ED Notes (Signed)
Pt made aware of need for urine specimen 

## 2016-10-21 ENCOUNTER — Encounter (HOSPITAL_COMMUNITY): Payer: Self-pay | Admitting: *Deleted

## 2016-10-21 DIAGNOSIS — Z818 Family history of other mental and behavioral disorders: Secondary | ICD-10-CM | POA: Diagnosis not present

## 2016-10-21 DIAGNOSIS — K3580 Unspecified acute appendicitis: Secondary | ICD-10-CM | POA: Diagnosis present

## 2016-10-21 DIAGNOSIS — M797 Fibromyalgia: Secondary | ICD-10-CM | POA: Diagnosis present

## 2016-10-21 DIAGNOSIS — I10 Essential (primary) hypertension: Secondary | ICD-10-CM | POA: Diagnosis present

## 2016-10-21 DIAGNOSIS — Z6831 Body mass index (BMI) 31.0-31.9, adult: Secondary | ICD-10-CM | POA: Diagnosis not present

## 2016-10-21 DIAGNOSIS — R109 Unspecified abdominal pain: Secondary | ICD-10-CM | POA: Diagnosis not present

## 2016-10-21 DIAGNOSIS — K760 Fatty (change of) liver, not elsewhere classified: Secondary | ICD-10-CM | POA: Diagnosis present

## 2016-10-21 DIAGNOSIS — Z7982 Long term (current) use of aspirin: Secondary | ICD-10-CM | POA: Diagnosis not present

## 2016-10-21 DIAGNOSIS — K5909 Other constipation: Secondary | ICD-10-CM | POA: Diagnosis present

## 2016-10-21 DIAGNOSIS — I9581 Postprocedural hypotension: Secondary | ICD-10-CM | POA: Diagnosis not present

## 2016-10-21 DIAGNOSIS — Z79899 Other long term (current) drug therapy: Secondary | ICD-10-CM | POA: Diagnosis not present

## 2016-10-21 DIAGNOSIS — J9382 Other air leak: Secondary | ICD-10-CM | POA: Diagnosis not present

## 2016-10-21 DIAGNOSIS — Z809 Family history of malignant neoplasm, unspecified: Secondary | ICD-10-CM | POA: Diagnosis not present

## 2016-10-21 DIAGNOSIS — G4733 Obstructive sleep apnea (adult) (pediatric): Secondary | ICD-10-CM | POA: Diagnosis present

## 2016-10-21 DIAGNOSIS — K219 Gastro-esophageal reflux disease without esophagitis: Secondary | ICD-10-CM | POA: Diagnosis present

## 2016-10-21 DIAGNOSIS — Z87891 Personal history of nicotine dependence: Secondary | ICD-10-CM | POA: Diagnosis not present

## 2016-10-21 LAB — CBC
HCT: 41.1 % (ref 39.0–52.0)
HEMOGLOBIN: 13.7 g/dL (ref 13.0–17.0)
MCH: 30.4 pg (ref 26.0–34.0)
MCHC: 33.3 g/dL (ref 30.0–36.0)
MCV: 91.1 fL (ref 78.0–100.0)
PLATELETS: 168 10*3/uL (ref 150–400)
RBC: 4.51 MIL/uL (ref 4.22–5.81)
RDW: 14.8 % (ref 11.5–15.5)
WBC: 23.4 10*3/uL — AB (ref 4.0–10.5)

## 2016-10-21 LAB — BASIC METABOLIC PANEL
ANION GAP: 6 (ref 5–15)
BUN: 23 mg/dL — ABNORMAL HIGH (ref 6–20)
CHLORIDE: 105 mmol/L (ref 101–111)
CO2: 25 mmol/L (ref 22–32)
CREATININE: 1.25 mg/dL — AB (ref 0.61–1.24)
Calcium: 8.3 mg/dL — ABNORMAL LOW (ref 8.9–10.3)
GFR calc non Af Amer: 55 mL/min — ABNORMAL LOW (ref >60)
Glucose, Bld: 165 mg/dL — ABNORMAL HIGH (ref 65–99)
Potassium: 4 mmol/L (ref 3.5–5.1)
SODIUM: 136 mmol/L (ref 135–145)

## 2016-10-21 MED ORDER — DEXTROSE-NACL 5-0.9 % IV SOLN
INTRAVENOUS | Status: DC
  Administered 2016-10-22: 11:00:00 via INTRAVENOUS
  Administered 2016-10-22: 1000 mL via INTRAVENOUS

## 2016-10-21 NOTE — Progress Notes (Signed)
1 Day Post-Op  Subjective: He feels better than his numbers would suggest.  He lost his IV so no IV fluids or Zosyn so far this AM.  No nausea with clears, but he does not like choices on clears.  Objective: Vital signs in last 24 hours: Temp:  [98.1 F (36.7 C)-99.1 F (37.3 C)] 98.8 F (37.1 C) (11/27 1000) Pulse Rate:  [56-119] 56 (11/27 1000) Resp:  [16-21] 18 (11/27 1000) BP: (92-139)/(50-83) 101/53 (11/27 1000) SpO2:  [92 %-100 %] 96 % (11/27 1000) Last BM Date: 10/19/16 350 this AM PO 1300 urine Afebrile, BP 94-109 range  He also got his hctz and Cozaar this AM Creatinine is up to 1.25 WBC os 23.4 this AM Intake/Output from previous day: 11/26 0701 - 11/27 0700 In: 2150 [I.V.:2150] Out: 1325 [Urine:1300; Blood:25] Intake/Output this shift: Total I/O In: 350 [P.O.:350] Out: 1000 [Urine:1000]  General appearance: alert, cooperative and no distress Resp: clear to auscultation bilaterally GI: soft, sites ok, not overly tender.  no BS.    Lab Results:   Recent Labs  10/20/16 1240 10/21/16 0433  WBC 7.0 23.4*  HGB 16.3 13.7  HCT 45.5 41.1  PLT 149* 168    BMET  Recent Labs  10/20/16 1240 10/21/16 0433  NA 136 136  K 3.4* 4.0  CL 103 105  CO2 24 25  GLUCOSE 104* 165*  BUN 21* 23*  CREATININE 1.18 1.25*  CALCIUM 8.9 8.3*   PT/INR No results for input(s): LABPROT, INR in the last 72 hours.   Recent Labs Lab 10/20/16 1240  AST 37  ALT 44  ALKPHOS 71  BILITOT 2.0*  PROT 7.0  ALBUMIN 3.9     Lipase     Component Value Date/Time   LIPASE 23 10/20/2016 1240     Studies/Results: Ct Abdomen Pelvis W Contrast  Result Date: 10/20/2016 CLINICAL DATA:  Patient with diffuse abdominal pain. EXAM: CT ABDOMEN AND PELVIS WITH CONTRAST TECHNIQUE: Multidetector CT imaging of the abdomen and pelvis was performed using the standard protocol following bolus administration of intravenous contrast. CONTRAST:  123mL ISOVUE-300 IOPAMIDOL (ISOVUE-300)  INJECTION 61% COMPARISON:  CT abdomen pelvis 06/13/2005. FINDINGS: Lower chest: Normal heart size. Minimal atelectasis within the lower lobes bilaterally. No pleural effusion. Hepatobiliary: Liver is diffusely low in attenuation suggestive of steatosis. No focal lesion identified. Gallbladder is unremarkable. No intrahepatic or extrahepatic biliary ductal dilatation. Pancreas: Unremarkable Spleen: Unremarkable Adrenals/Urinary Tract: Normal adrenal glands. Kidneys enhance symmetrically with contrast. No hydronephrosis. Urinary bladder is unremarkable. 12 mm partially exophytic cyst off the inferior pole of the right kidney. Too small to characterize low-attenuation lesion interpolar region left kidney. Stomach/Bowel: Descending colonic diverticulosis. No CT evidence for acute diverticulitis. The appendix is dilated measuring 12 mm with surrounding fat stranding (image 65; series 2). Small hiatal hernia. Normal morphology of the stomach. Vascular/Lymphatic: Normal caliber abdominal aorta with peripheral calcified atherosclerotic plaque. No retroperitoneal lymphadenopathy. Reproductive: Prostate is unremarkable. Other: None. Musculoskeletal: Lumbar spine degenerative changes. No aggressive or acute appearing osseous lesions. IMPRESSION: Findings compatible with acute non perforated appendicitis. Sigmoid colonic diverticulosis without CT evidence for acute diverticulitis. Aortic atherosclerosis. Hepatic steatosis. These results were called by telephone at the time of interpretation on 10/20/2016 at 2:15 pm to Dr. Tomi Bamberger, who verbally acknowledged these results. Electronically Signed   By: Lovey Newcomer M.D.   On: 10/20/2016 14:18   Prior to Admission medications   Medication Sig Start Date End Date Taking? Authorizing Provider  acetaminophen (TYLENOL) 500 MG tablet  Take 1,000 mg by mouth every 6 (six) hours as needed for fever.   Yes Historical Provider, MD  ALPRAZolam Duanne Moron) 0.25 MG tablet Take 0.25 mg by mouth  daily as needed for anxiety.  05/13/16  Yes Historical Provider, MD  aspirin 81 MG chewable tablet Chew 81 mg by mouth at bedtime.    Yes Historical Provider, MD  buPROPion (WELLBUTRIN SR) 150 MG 12 hr tablet Take 150 mg by mouth daily.  05/06/16  Yes Historical Provider, MD  Fish Oil-Cholecalciferol (OMEGA-3 + VITAMIN D3 PO) Take by mouth.   Yes Historical Provider, MD  losartan-hydrochlorothiazide (HYZAAR) 100-12.5 MG tablet Take 1 tablet by mouth daily.  04/05/16  Yes Historical Provider, MD  Magnesium 250 MG TABS Take 500 mg by mouth daily.    Yes Historical Provider, MD  Multiple Vitamin (MULTIVITAMIN) tablet Take 1 tablet by mouth daily.   Yes Historical Provider, MD  omeprazole (PRILOSEC) 20 MG capsule Take 20 mg by mouth every other day.    Yes Historical Provider, MD    Medications: . acetaminophen  1,000 mg Oral Q6H  . buPROPion  150 mg Oral BID  . heparin subcutaneous  5,000 Units Subcutaneous Q8H  . hydrochlorothiazide  12.5 mg Oral Daily  . losartan  100 mg Oral Daily  . pantoprazole  40 mg Oral Daily  . piperacillin-tazobactam (ZOSYN)  IV  3.375 g Intravenous Q8H   . dextrose 5 % and 0.45 % NaCl with KCl 20 mEq/L 100 mL/hr at 10/20/16 2253   Assessment/Plan Acute suppurative appendicitis S/p laparoscopic appendectomy 10/20/16, Dr. Redmond Pulling Elevated WBC Elevated Creatinine Hx of fibromyalgia  Hypertension FEN:  Clears/IV fluids =>> advance diet ID: Zosyn day 2 DVT: Heparin    Plan:  Restart the IV and continue the Zosyn.  Hydrate, hold antihypertensives till BP and renal function are Better.  Recheck labs, and advance diet as he tolerates this AM.  Check orthostatic BP's.  He lives alone.  LOS: 0 days    Royalti Schauf 10/21/2016 [phone]

## 2016-10-22 ENCOUNTER — Telehealth: Payer: Self-pay | Admitting: Neurology

## 2016-10-22 LAB — BASIC METABOLIC PANEL
Anion gap: 6 (ref 5–15)
BUN: 29 mg/dL — AB (ref 6–20)
CHLORIDE: 107 mmol/L (ref 101–111)
CO2: 27 mmol/L (ref 22–32)
Calcium: 8.6 mg/dL — ABNORMAL LOW (ref 8.9–10.3)
Creatinine, Ser: 1.27 mg/dL — ABNORMAL HIGH (ref 0.61–1.24)
GFR calc Af Amer: >60 mL/min (ref >60)
GFR calc non Af Amer: 54 mL/min — ABNORMAL LOW (ref >60)
GLUCOSE: 104 mg/dL — AB (ref 65–99)
POTASSIUM: 3.4 mmol/L — AB (ref 3.5–5.1)
Sodium: 140 mmol/L (ref 135–145)

## 2016-10-22 LAB — CBC
HCT: 40.8 % (ref 39.0–52.0)
HEMOGLOBIN: 13.9 g/dL (ref 13.0–17.0)
MCH: 31.2 pg (ref 26.0–34.0)
MCHC: 34.1 g/dL (ref 30.0–36.0)
MCV: 91.5 fL (ref 78.0–100.0)
PLATELETS: 155 10*3/uL (ref 150–400)
RBC: 4.46 MIL/uL (ref 4.22–5.81)
RDW: 15 % (ref 11.5–15.5)
WBC: 14.3 10*3/uL — ABNORMAL HIGH (ref 4.0–10.5)

## 2016-10-22 NOTE — Progress Notes (Signed)
2 Days Post-Op  Subjective: He looks good and would like to go home.  Objective: Vital signs in last 24 hours: Temp:  [97.6 F (36.4 C)-98 F (36.7 C)] 98 F (36.7 C) (11/28 0630) Pulse Rate:  [56-78] 78 (11/28 0630) Resp:  [18-20] 18 (11/28 0630) BP: (97-119)/(43-64) 119/64 (11/28 0630) SpO2:  [96 %-100 %] 100 % (11/28 0630) Last BM Date: 10/19/16 1880 PO IV 730 recorded Urine 2730 Afebrile, VSS BP better Creatinine is 1.27 , WBC down to 14.3 Intake/Output from previous day: 11/27 0701 - 11/28 0700 In: 2660.7 [P.O.:1880; I.V.:730.7; IV Piggyback:50] Out: 2730 [Urine:2730] Intake/Output this shift: No intake/output data recorded.  General appearance: alert, cooperative and no distress Resp: clear to auscultation bilaterally GI: soft sore, sites ok tolerating diet.  Lab Results:   Recent Labs  10/21/16 0433 10/22/16 0445  WBC 23.4* 14.3*  HGB 13.7 13.9  HCT 41.1 40.8  PLT 168 155    BMET  Recent Labs  10/21/16 0433 10/22/16 0445  NA 136 140  K 4.0 3.4*  CL 105 107  CO2 25 27  GLUCOSE 165* 104*  BUN 23* 29*  CREATININE 1.25* 1.27*  CALCIUM 8.3* 8.6*   PT/INR No results for input(s): LABPROT, INR in the last 72 hours.   Recent Labs Lab 10/20/16 1240  AST 37  ALT 44  ALKPHOS 71  BILITOT 2.0*  PROT 7.0  ALBUMIN 3.9     Lipase     Component Value Date/Time   LIPASE 23 10/20/2016 1240     Studies/Results: Ct Abdomen Pelvis W Contrast  Result Date: 10/20/2016 CLINICAL DATA:  Patient with diffuse abdominal pain. EXAM: CT ABDOMEN AND PELVIS WITH CONTRAST TECHNIQUE: Multidetector CT imaging of the abdomen and pelvis was performed using the standard protocol following bolus administration of intravenous contrast. CONTRAST:  11mL ISOVUE-300 IOPAMIDOL (ISOVUE-300) INJECTION 61% COMPARISON:  CT abdomen pelvis 06/13/2005. FINDINGS: Lower chest: Normal heart size. Minimal atelectasis within the lower lobes bilaterally. No pleural effusion.  Hepatobiliary: Liver is diffusely low in attenuation suggestive of steatosis. No focal lesion identified. Gallbladder is unremarkable. No intrahepatic or extrahepatic biliary ductal dilatation. Pancreas: Unremarkable Spleen: Unremarkable Adrenals/Urinary Tract: Normal adrenal glands. Kidneys enhance symmetrically with contrast. No hydronephrosis. Urinary bladder is unremarkable. 12 mm partially exophytic cyst off the inferior pole of the right kidney. Too small to characterize low-attenuation lesion interpolar region left kidney. Stomach/Bowel: Descending colonic diverticulosis. No CT evidence for acute diverticulitis. The appendix is dilated measuring 12 mm with surrounding fat stranding (image 65; series 2). Small hiatal hernia. Normal morphology of the stomach. Vascular/Lymphatic: Normal caliber abdominal aorta with peripheral calcified atherosclerotic plaque. No retroperitoneal lymphadenopathy. Reproductive: Prostate is unremarkable. Other: None. Musculoskeletal: Lumbar spine degenerative changes. No aggressive or acute appearing osseous lesions. IMPRESSION: Findings compatible with acute non perforated appendicitis. Sigmoid colonic diverticulosis without CT evidence for acute diverticulitis. Aortic atherosclerosis. Hepatic steatosis. These results were called by telephone at the time of interpretation on 10/20/2016 at 2:15 pm to Dr. Tomi Bamberger, who verbally acknowledged these results. Electronically Signed   By: Lovey Newcomer M.D.   On: 10/20/2016 14:18    Medications: . acetaminophen  1,000 mg Oral Q6H  . buPROPion  150 mg Oral BID  . heparin subcutaneous  5,000 Units Subcutaneous Q8H  . pantoprazole  40 mg Oral Daily   . dextrose 5 % and 0.9% NaCl 20 mL/hr at 10/21/16 2300   Assessment/Plan Acute suppurative appendicitis S/p laparoscopic appendectomy 10/20/16, Dr. Redmond Pulling Elevated WBC Elevated Creatinine Hx of  fibromyalgia  Hypertension FEN:  soft diet/ IV fluids =>> advance diet ID: Zosyn day  3 DVT: Heparin   We are going to continue antibiotics and hydrate more recheck in AM.   LOS: 1 day    Lindzie Boxx 10/22/2016 [phone]

## 2016-10-22 NOTE — Telephone Encounter (Signed)
I spoke to patient and advised him that he will be ok as far as compliance data. At his last visit he showed full compliance, and next appt is not until May. I stated that he will be ok as far as insurance is concerned if we can show at his next appt he has still been compliant in the last 30-90 days. He voiced understanding.

## 2016-10-22 NOTE — Telephone Encounter (Signed)
Pt called to advise he has been in the hospital since 10/20/16 and has been using the hospital cpap. He knows his machine must be used regular or ins won't pay. Please call to let him know how this should be handled

## 2016-10-23 ENCOUNTER — Encounter (HOSPITAL_COMMUNITY): Payer: Self-pay | Admitting: General Surgery

## 2016-10-23 DIAGNOSIS — K3580 Unspecified acute appendicitis: Secondary | ICD-10-CM

## 2016-10-23 HISTORY — DX: Unspecified acute appendicitis: K35.80

## 2016-10-23 LAB — BASIC METABOLIC PANEL
Anion gap: 5 (ref 5–15)
BUN: 21 mg/dL — AB (ref 6–20)
CALCIUM: 8.1 mg/dL — AB (ref 8.9–10.3)
CO2: 26 mmol/L (ref 22–32)
CREATININE: 0.97 mg/dL (ref 0.61–1.24)
Chloride: 109 mmol/L (ref 101–111)
GFR calc Af Amer: >60 mL/min (ref >60)
GLUCOSE: 119 mg/dL — AB (ref 65–99)
Potassium: 3.4 mmol/L — ABNORMAL LOW (ref 3.5–5.1)
SODIUM: 140 mmol/L (ref 135–145)

## 2016-10-23 LAB — CBC
HCT: 40.6 % (ref 39.0–52.0)
Hemoglobin: 13.6 g/dL (ref 13.0–17.0)
MCH: 30.4 pg (ref 26.0–34.0)
MCHC: 33.5 g/dL (ref 30.0–36.0)
MCV: 90.8 fL (ref 78.0–100.0)
PLATELETS: 171 10*3/uL (ref 150–400)
RBC: 4.47 MIL/uL (ref 4.22–5.81)
RDW: 14.6 % (ref 11.5–15.5)
WBC: 6.9 10*3/uL (ref 4.0–10.5)

## 2016-10-23 MED ORDER — PSYLLIUM 95 % PO PACK
1.0000 | PACK | Freq: Every day | ORAL | Status: DC
  Administered 2016-10-23: 1 via ORAL
  Filled 2016-10-23: qty 1

## 2016-10-23 MED ORDER — SACCHAROMYCES BOULARDII 250 MG PO CAPS: ORAL_CAPSULE | ORAL | Status: DC

## 2016-10-23 MED ORDER — AMOXICILLIN-POT CLAVULANATE 875-125 MG PO TABS
1.0000 | ORAL_TABLET | Freq: Two times a day (BID) | ORAL | Status: DC
  Administered 2016-10-23: 1 via ORAL
  Filled 2016-10-23: qty 1

## 2016-10-23 MED ORDER — OXYCODONE HCL 5 MG PO TABS
ORAL_TABLET | ORAL | 0 refills | Status: DC
Start: 2016-10-23 — End: 2018-06-25

## 2016-10-23 MED ORDER — POLYETHYLENE GLYCOL 3350 17 G PO PACK
17.0000 g | PACK | Freq: Once | ORAL | Status: AC
  Administered 2016-10-23: 17 g via ORAL
  Filled 2016-10-23: qty 1

## 2016-10-23 MED ORDER — LOSARTAN POTASSIUM-HCTZ 100-12.5 MG PO TABS: ORAL_TABLET | ORAL | Status: DC

## 2016-10-23 MED ORDER — AMOXICILLIN-POT CLAVULANATE 875-125 MG PO TABS: 1.0000 | ORAL_TABLET | Freq: Two times a day (BID) | ORAL | 0 refills | Status: DC

## 2016-10-23 MED ORDER — ACETAMINOPHEN 325 MG PO TABS: ORAL_TABLET | ORAL | Status: AC

## 2016-10-23 MED ORDER — PSYLLIUM 95 % PO PACK: PACK | ORAL | Status: DC

## 2016-10-23 MED ORDER — POLYETHYLENE GLYCOL 3350 17 G PO PACK: PACK | ORAL | 0 refills | Status: DC

## 2016-10-23 MED ORDER — SACCHAROMYCES BOULARDII 250 MG PO CAPS
250.0000 mg | ORAL_CAPSULE | Freq: Two times a day (BID) | ORAL | Status: DC
  Administered 2016-10-23: 250 mg via ORAL
  Filled 2016-10-23: qty 1

## 2016-10-23 NOTE — Discharge Instructions (Signed)
[facility] ______[facility] °LAPAROSCOPIC SURGERY: POST OP INSTRUCTIONS °Always review your discharge instruction sheet given to you by the facility where your surgery was performed. °IF YOU HAVE DISABILITY OR FAMILY LEAVE FORMS, YOU MUST BRING THEM TO THE OFFICE FOR PROCESSING.   °DO NOT GIVE THEM TO YOUR DOCTOR. ° °1. A prescription for pain medication may be given to you upon discharge.  Take your pain medication as prescribed, if needed.  If narcotic pain medicine is not needed, then you may take acetaminophen (Tylenol) or ibuprofen (Advil) as needed. °2. Take your usually prescribed medications unless otherwise directed. °3. If you need a refill on your pain medication, please contact your pharmacy.  They will contact our office to request authorization. Prescriptions will not be filled after 5pm or on week-ends. °4. You should follow a light diet the first few days after arrival home, such as soup and crackers, etc.  Be sure to include lots of fluids daily. °5. Most patients will experience some swelling and bruising in the area of the incisions.  Ice packs will help.  Swelling and bruising can take several days to resolve.  °6. It is common to experience some constipation if taking pain medication after surgery.  Increasing fluid intake and taking a stool softener (such as Colace) will usually help or prevent this problem from occurring.  A mild laxative (Milk of Magnesia or Miralax) should be taken according to package instructions if there are no bowel movements after 48 hours. °7. Unless discharge instructions indicate otherwise, you may remove your bandages 24-48 hours after surgery, and you may shower at that time.  You may have steri-strips (small skin tapes) in place directly over the incision.  These strips should be left on the skin for 7-10 days.  If your surgeon used skin glue on the incision, you may shower in 24 hours.  The glue will flake off over the next 2-3 weeks.  Any sutures or  staples will be removed at the office during your follow-up visit. °8. ACTIVITIES:  You may resume regular (light) daily activities beginning the next day--such as daily self-care, walking, climbing stairs--gradually increasing activities as tolerated.  You may have sexual intercourse when it is comfortable.  Refrain from any heavy lifting or straining until approved by your doctor. °a. You may drive when you are no longer taking prescription pain medication, you can comfortably wear a seatbelt, and you can safely maneuver your car and apply brakes. °b. RETURN TO WORK:  __________________________________________________________ °9. You should see your doctor in the office for a follow-up appointment approximately 2-3 weeks after your surgery.  Make sure that you call for this appointment within a day or two after you arrive home to insure a convenient appointment time. °10. OTHER INSTRUCTIONS: __________________________________________________________________________________________________________________________ __________________________________________________________________________________________________________________________ °WHEN TO CALL YOUR DOCTOR: °1. Fever over 101.0 °2. Inability to urinate °3. Continued bleeding from incision. °4. Increased pain, redness, or drainage from the incision. °5. Increasing abdominal pain ° °The clinic staff is available to answer your questions during regular business hours.  Please don’t hesitate to call and ask to speak to one of the nurses for clinical concerns.  If you have a medical emergency, go to the nearest emergency room or call 911.  A surgeon from [facility] is always on call at the hospital. °[address], [address], [place], [place]  [address] ? [address] [address], [place],    [address] °[phone] ? [phone] ? FAX [phone] °Web site:   [email or web address]   Laparoscopic Appendectomy, Adult, Care After Refer to  this sheet in the next few weeks. These instructions provide you with information about caring for yourself after your procedure. Your health care provider may also give you more specific instructions. Your treatment has been planned according to current medical practices, but problems sometimes occur. Call your health care provider if you have any problems or questions after your procedure. What can I expect after the procedure? After the procedure, it is common to have:  A decrease in your energy level.  Mild pain in the area where the surgical cuts (incisions) were made.  Constipation. This can be caused by pain medicine and a decrease in your activity. Follow these instructions at home: Medicines  Take over-the-counter and prescription medicines only as told by your health care provider.  Do not drive for 24 hours if you received a sedative.  Do not drive or operate heavy machinery while taking prescription pain medicine.  If you were prescribed an antibiotic medicine, take it as told by your health care provider. Do not stop taking the antibiotic even if you start to feel better. Activity  For 3 weeks or as long as told by your health care provider:  Do not lift anything that is heavier than 10 pounds (4.5 kg).  Do not play contact sports.  Gradually return to your normal activities. Ask your health care provider what activities are safe for you. Bathing  Keep your incisions clean and dry. Clean them as often as told by your health care provider:  Gently wash the incisions with soap and water.  Rinse the incisions with water to remove all soap.  Pat the incisions dry with a clean towel. Do not rub the incisions.  You may take showers after 48 hours.  Do not take baths, swim, or use hot tubs for 2 weeks or as told by your health care provider. Incision care  Follow instructions from your healthcare provider about how to take care of your incisions. Make sure  you:  Wash your hands with soap and water before you change your bandage (dressing). If soap and water are not available, use hand sanitizer.  Change your dressing as told by your health care provider.  Leave stitches (sutures), skin glue, or adhesive strips in place. These skin closures may need to stay in place for 2 weeks or longer. If adhesive strip edges start to loosen and curl up, you may trim the loose edges. Do not remove adhesive strips completely unless your health care provider tells you to do that.  Check your incision areas every day for signs of infection. Check for:  More redness, swelling, or pain.  More fluid or blood.  Warmth.  Pus or a bad smell. Other Instructions  If you were sent home with a drain, follow instructions from your health care provider about how to care for the drain and how to empty it.  Take deep breaths. This helps to prevent your lungs from becoming inflamed.  To relieve and prevent constipation:  Drink plenty of fluids.  Eat plenty of fruits and vegetables.  Keep all follow-up visits as told by your health care provider. This is important. Contact a health care provider if:  You have more redness, swelling, or pain around an incision.  You have more fluid or blood coming from an incision.  Your incision feels warm to the touch.  You have pus or a bad smell coming from an incision or dressing.  Your incision edges break open after your sutures have been removed.  You have increasing pain in your shoulders.  You feel dizzy or you faint.  You develop shortness of breath.  You keep feeling nauseous or vomiting.  You have diarrhea or you cannot control your bowel functions.  You lose your appetite.  You develop swelling or pain in your legs. Get help right away if:  You have a fever.  You develop a rash.  You have difficulty breathing.  You have sharp pains in your chest. This information is not intended to replace  advice given to you by your health care provider. Make sure you discuss any questions you have with your health care provider. Document Released: 11/11/2005 Document Revised: 04/12/2016 Document Reviewed: 05/01/2015 Elsevier Interactive Patient Education  2017 [employer].   High-Fiber Diet Fiber, also called dietary fiber, is a type of carbohydrate found in fruits, vegetables, whole grains, and beans. A high-fiber diet can have many health benefits. Your health care provider may recommend a high-fiber diet to help:  Prevent constipation. Fiber can make your bowel movements more regular.  Lower your cholesterol.  Relieve hemorrhoids, uncomplicated diverticulosis, or irritable bowel syndrome.  Prevent overeating as part of a weight-loss plan.  Prevent heart disease, type 2 diabetes, and certain cancers. What is my plan? The recommended daily intake of fiber includes:  38 grams for men under age 39.  44 grams for men over age 11.  72 grams for women under age 48.  14 grams for women over age 103. You can get the recommended daily intake of dietary fiber by eating a variety of fruits, vegetables, grains, and beans. Your health care provider may also recommend a fiber supplement if it is not possible to get enough fiber through your diet. What do I need to know about a high-fiber diet?  Fiber supplements have not been widely studied for their effectiveness, so it is better to get fiber through food sources.  Always check the fiber content on thenutrition facts label of any prepackaged food. Look for foods that contain at least 5 grams of fiber per serving.  Ask your dietitian if you have questions about specific foods that are related to your condition, especially if those foods are not listed in the following section.  Increase your daily fiber consumption gradually. Increasing your intake of dietary fiber too quickly may cause bloating, cramping, or gas.  Drink plenty of water.  Water helps you to digest fiber. What foods can I eat? Grains  Whole-grain breads. Multigrain cereal. Oats and oatmeal. Brown rice. Barley. Bulgur wheat. [place]. Bran muffins. Popcorn. Rye wafer crackers. Vegetables  Sweet potatoes. Spinach. Kale. Artichokes. Cabbage. Broccoli. Green peas. Carrots. Squash. Fruits  Berries. Pears. Apples. Oranges. Avocados. Prunes and raisins. Dried figs. Meats and Other Protein Sources  [place], kidney, pinto, and soy beans. Split peas. Lentils. Nuts and seeds. Dairy  Fiber-fortified yogurt. Beverages  Fiber-fortified soy milk. Fiber-fortified orange juice. Other  Fiber bars. The items listed above may not be a complete list of recommended foods or beverages. Contact your dietitian for more options.  What foods are not recommended? Grains  White bread. Pasta made with refined flour. White rice. Vegetables  Fried potatoes. Canned vegetables. Well-cooked vegetables. Fruits  Fruit juice. Cooked, strained fruit. Meats and Other Protein Sources  Fatty cuts of meat. Fried Sales executive or fried fish. Dairy  Milk. Yogurt. Cream cheese. Sour cream. Beverages  Soft drinks. Other  Cakes and pastries. Butter and oils.  The items listed above may not be a complete list of foods and beverages to avoid. Contact your dietitian for more information.  What are some tips for including high-fiber foods in my diet?  Eat a wide variety of high-fiber foods.  Make sure that half of all grains consumed each day are whole grains.  Replace breads and cereals made from refined flour or white flour with whole-grain breads and cereals.  Replace white rice with brown rice, bulgur wheat, or millet.  Start the day with a breakfast that is high in fiber, such as a cereal that contains at least 5 grams of fiber per serving.  Use beans in place of meat in soups, salads, or pasta.  Eat high-fiber snacks, such as berries, raw vegetables, nuts, or popcorn. This information is not  intended to replace advice given to you by your health care provider. Make sure you discuss any questions you have with your health care provider. Document Released: 11/11/2005 Document Revised: 04/18/2016 Document Reviewed: 04/26/2014 Elsevier Interactive Patient Education  2017 [employer].

## 2016-10-23 NOTE — Progress Notes (Signed)
3 Days Post-Op  Subjective: His biggest complaint is intermittent pain right lower back that has been coming and going for years.  Tolerating diet, but no BM since admit, he has some chronic issues with constipation and then loose stools.  Sites all look fine.  Objective: Vital signs in last 24 hours: Temp:  [98.4 F (36.9 C)-98.6 F (37 C)] 98.4 F (36.9 C) (11/29 0532) Pulse Rate:  [52-65] 65 (11/29 0532) Resp:  [18] 18 (11/29 0532) BP: (122-148)/(67-86) 122/67 (11/29 0532) SpO2:  [97 %-98 %] 97 % (11/29 0532) Last BM Date: 10/19/16 992 PO 2050 IV Urine 1900 Afebrile, VSS Creatinine is back to normal and WBC is also back to normal  Intake/Output from previous day: 11/28 0701 - 11/29 0700 In: 3042.7 [P.O.:992; I.V.:2050.7] Out: 1900 [Urine:1900] Intake/Output this shift: No intake/output data recorded.  General appearance: alert, cooperative and no distress Resp: clear to auscultation bilaterally GI: soft, sore, sites all look good. tolerating diet, + BS, no BM  Lab Results:   Recent Labs  10/22/16 0445 10/23/16 0451  WBC 14.3* 6.9  HGB 13.9 13.6  HCT 40.8 40.6  PLT 155 171    BMET  Recent Labs  10/22/16 0445 10/23/16 0451  NA 140 140  K 3.4* 3.4*  CL 107 109  CO2 27 26  GLUCOSE 104* 119*  BUN 29* 21*  CREATININE 1.27* 0.97  CALCIUM 8.6* 8.1*   PT/INR No results for input(s): LABPROT, INR in the last 72 hours.   Recent Labs Lab 10/20/16 1240  AST 37  ALT 44  ALKPHOS 71  BILITOT 2.0*  PROT 7.0  ALBUMIN 3.9     Lipase     Component Value Date/Time   LIPASE 23 10/20/2016 1240     Studies/Results: No results found.  Medications: . acetaminophen  1,000 mg Oral Q6H  . buPROPion  150 mg Oral BID  . heparin subcutaneous  5,000 Units Subcutaneous Q8H  . pantoprazole  40 mg Oral Daily    Assessment/Plan Acute suppurative appendicitis S/p laparoscopic appendectomy 10/20/16, Dr. Redmond Pulling Elevated WBC back to normal  Elevated Creatinine  back to normal Hx of fibromyalgia  Hypertension FEN: soft diet/ IV fluids =>>advance diet ID: Zosyn dose 3 completed, will plan 7 more total of 10 days DVT: Heparin  Plan:  laxative this AM, start on PO Augmentin, probiotic and plan to DC home today.  Recommended high fiber diet, for chronic constipation.  He does have a a 12 mm cyst lower right kidney, but I doubt that is causing pain.  I will ask Dr. Excell Seltzer to review CT also.         LOS: 2 days    Alverta Caccamo 10/23/2016 [phone]

## 2016-10-23 NOTE — Discharge Summary (Signed)
Physician Discharge Summary  Patient ID: Nathan Delgado MRN: [id number] DOB/AGE: [birth year] 74 y.o.  Admit date: 10/20/2016 Discharge date: 10/23/2016  Admission Diagnoses:  Acute appendicitis Hx of fibromyalgia  Hypertension Body mass index is 31.57  OSA with CPAP   Discharge Diagnoses:  Suppurative appendicitis Elevated WBC back to normal  Elevated Creatinine back to normal Post op hypotension Hx of fibromyalgia  Hypertension OSA with CPAP  Active Problems:     PROCEDURES: S/P laparoscopic appendectomy, 10/20/16, Dr. Janalyn Rouse Course:   75 year old obese gentleman developed abdominal pain yesterday afternoon. It was persistent and did not go away. This morning it became more focused on the right side and he developed nausea. He also developed fever and chills. He contacted his son who advised him to call EMS. He was brought to the emergency room. He denies any vomiting, diarrhea or constipation. Workup revealed appendicitis. He does drink 2 alcoholic beverages per day but denies smoking.  He was taken to the OR later the day of admission and underwent appendectomy.  He had a suppurative appendix, along with some post op hypotension, a rise in his creatinine, and elevated WBC.  He was sore but felt fairly well.  We opted to keep him hydrated. We continued his antibiotics, but he actually only got 3 doses of Zosyn. With hydration and antibiotics his creatinine returned to normal.  His WBC improved with hydration and antibiotics also.  He got his antihypertensive medicines before we saw him the first post op day.  We held them for the next 2 day, and his BP ranged from 144/86 to 122/67.  We did not restart his BP medications on discharge and have ask him to see Dr. Sharlett Iles next week.  He can recheck labs and decide on resuming BP medicines at that time.  He was doing well at discharge.  He has issues with constipation, and I recommended high fiber diet, fiber  supplement, for home use.  We also recommended he use Miralax as needed at home.  He got one dose here just before discharge so I don't know if he was successful before discharge with BM.    Condition on d/c:  Improved  CBC Latest Ref Rng & Units 10/23/2016 10/22/2016 10/21/2016  WBC 4.0 - 10.5 K/uL 6.9 14.3(H) 23.4(H)  Hemoglobin 13.0 - 17.0 g/dL 13.6 13.9 13.7  Hematocrit 39.0 - 52.0 % 40.6 40.8 41.1  Platelets 150 - 400 K/uL 171 155 168    CMP Latest Ref Rng & Units 10/23/2016 10/22/2016 10/21/2016  Glucose 65 - 99 mg/dL 119(H) 104(H) 165(H)  BUN 6 - 20 mg/dL 21(H) 29(H) 23(H)  Creatinine 0.61 - 1.24 mg/dL 0.97 1.27(H) 1.25(H)  Sodium 135 - 145 mmol/L 140 140 136  Potassium 3.5 - 5.1 mmol/L 3.4(L) 3.4(L) 4.0  Chloride 101 - 111 mmol/L 109 107 105  CO2 22 - 32 mmol/L 26 27 25   Calcium 8.9 - 10.3 mg/dL 8.1(L) 8.6(L) 8.3(L)  Total Protein 6.5 - 8.1 g/dL - - -  Total Bilirubin 0.3 - 1.2 mg/dL - - -  Alkaline Phos 38 - 126 U/L - - -  AST 15 - 41 U/L - - -  ALT 17 - 63 U/L - - -    Ref Range & Units 10/20/16 1240  Sodium 135 - 145 mmol/L 136   Potassium 3.5 - 5.1 mmol/L 3.4    Chloride 101 - 111 mmol/L 103   CO2 22 - 32 mmol/L 24   Glucose,  Bld 65 - 99 mg/dL 104    BUN 6 - 20 mg/dL 21    Creatinine, Ser 0.61 - 1.24 mg/dL 1.18   Calcium 8.9 - 10.3 mg/dL 8.9   Total Protein 6.5 - 8.1 g/dL 7.0   Albumin 3.5 - 5.0 g/dL 3.9   AST 15 - 41 U/L 37   ALT 17 - 63 U/L 44   Alkaline Phosphatase 38 - 126 U/L 71   Total Bilirubin 0.3 - 1.2 mg/dL 2.0    GFR calc non Af Amer >60 mL/min 59    GFR calc Af Amer >60 mL/min >60      Ref Range & Units 3d ago (10/20/16) 15yr ago (11/11/08) 64yr ago (11/11/08)    WBC 4.0 - 10.5 K/uL 7.0   5.2    RBC 4.22 - 5.81 MIL/uL 5.18   5.11    Hemoglobin 13.0 - 17.0 g/dL 16.3   15.7    HCT 39.0 - 52.0 % 45.5   46.2    MCV 78.0 - 100.0 fL 87.8   90.3    MCH 26.0 - 34.0 pg 31.5      MCHC 30.0 - 36.0 g/dL 35.8   34.1    RDW 11.5 - 15.5 % 14.       Disposition: 01-Home or Self Care     Medication List    STOP taking these medications   Magnesium 250 MG Tabs     TAKE these medications   acetaminophen 325 MG tablet Commonly known as:  TYLENOL You can take up to 4000 mg of this per day. What changed:  medication strength  how much to take  how to take this  when to take this  reasons to take this  additional instructions   ALPRAZolam 0.25 MG tablet Commonly known as:  XANAX Take 0.25 mg by mouth daily as needed for anxiety.   amoxicillin-clavulanate 875-125 MG tablet Commonly known as:  AUGMENTIN Take 1 tablet by mouth every 12 (twelve) hours.   aspirin 81 MG chewable tablet Chew 81 mg by mouth at bedtime.   buPROPion 150 MG 12 hr tablet Commonly known as:  WELLBUTRIN SR Take 150 mg by mouth daily.   losartan-hydrochlorothiazide 100-12.5 MG tablet Commonly known as:  HYZAAR See your primary care doctor next week and resume this after he see you. What changed:  how much to take  how to take this  when to take this  additional instructions   multivitamin tablet Take 1 tablet by mouth daily.   OMEGA-3 + VITAMIN D3 PO Take by mouth.   omeprazole 20 MG capsule Commonly known as:  PRILOSEC Take 20 mg by mouth every other day.   oxyCODONE 5 MG immediate release tablet Commonly known as:  Oxy IR/ROXICODONE Use for pain not relieved by Tylenol.   polyethylene glycol packet Commonly known as:  MIRALAX / GLYCOLAX You can use this as needed for constipation.  Follow package instructions.  If you use the fiber supplement, it should relieve most of your chronic constipation issues.  You can buy this over the counter.   psyllium 95 % Pack Commonly known as:  HYDROCIL/METAMUCIL Use package instructions, and adjust so you have a soft bowel movement daily.  You can buy over the counter.   saccharomyces boulardii 250 MG capsule Commonly known as:  FLORASTOR You can buy this over the counter at any  drug store, I would take it for a month and then discontinue it.  Follow-up Information    Donnajean Lopes, MD Follow up.   Specialty:  Internal Medicine Why:  CAll and let him look at your labs and blood pressure next week. Let him decide about restarting blood pressure medicine.   Contact information: [address] [place] [address] [phone]        [facility] Follow up.   Specialty:  General Surgery Why:  Our office should call you with follow up appointment in our [place] in the next 24 hours.  If you do not hear by Friday call and ask for an appointment in 2-3 weeks. Contact information: [address] [place] [address] [phone]           Signed: Earnstine Regal 10/23/2016, 5:32 PM

## 2016-11-21 DIAGNOSIS — R531 Weakness: Secondary | ICD-10-CM | POA: Diagnosis not present

## 2016-11-21 DIAGNOSIS — R42 Dizziness and giddiness: Secondary | ICD-10-CM | POA: Diagnosis not present

## 2016-11-21 DIAGNOSIS — I10 Essential (primary) hypertension: Secondary | ICD-10-CM | POA: Diagnosis not present

## 2016-11-21 DIAGNOSIS — Z6831 Body mass index (BMI) 31.0-31.9, adult: Secondary | ICD-10-CM | POA: Diagnosis not present

## 2016-11-21 DIAGNOSIS — N281 Cyst of kidney, acquired: Secondary | ICD-10-CM | POA: Diagnosis not present

## 2016-11-21 DIAGNOSIS — F5104 Psychophysiologic insomnia: Secondary | ICD-10-CM | POA: Diagnosis not present

## 2016-12-10 ENCOUNTER — Telehealth: Payer: Self-pay | Admitting: Neurology

## 2016-12-10 NOTE — Telephone Encounter (Signed)
Patient reports new symptom if he leans down he is going to fall. He also said one day he looked at a picture on the wall and they were moving and another time he woke up and vision was blurry. That started about 3 mths ago. An appointment was made for 01/28/17.  RN pls call to check if he needs to be seen sooner.

## 2016-12-13 NOTE — Telephone Encounter (Signed)
Dr Jaynee Eagles- are you ok with this? This was sent to me Tuesday night right when we were closing

## 2016-12-13 NOTE — Telephone Encounter (Signed)
Nathan Delgado, I say patient over a year ago for burning thighs, this is a completely new problem. He should be seen by his primary care asap and not wait for this appointment. Primary care can contact us if they think this is urgent we will see him a lot sooner.

## 2016-12-13 NOTE — Telephone Encounter (Signed)
Called and spoke to pt. Relayed message per AA,MD note. He verbalized understanding and will contact PCP to set up appt.  Advised him to proceed to ED if symptoms worsen or if he falls, hits head, or any serious injury. He stated his symptoms are not that bad.  Advised if PCP feels he should f/u with [place], then we are happy to see him at that point. He verbalized understanding.   Cx appt on 01/28/17 with AA,MD.

## 2016-12-17 DIAGNOSIS — R42 Dizziness and giddiness: Secondary | ICD-10-CM | POA: Diagnosis not present

## 2016-12-17 DIAGNOSIS — J3489 Other specified disorders of nose and nasal sinuses: Secondary | ICD-10-CM | POA: Diagnosis not present

## 2016-12-17 DIAGNOSIS — I10 Essential (primary) hypertension: Secondary | ICD-10-CM | POA: Diagnosis not present

## 2016-12-17 DIAGNOSIS — R05 Cough: Secondary | ICD-10-CM | POA: Diagnosis not present

## 2016-12-17 DIAGNOSIS — Z6831 Body mass index (BMI) 31.0-31.9, adult: Secondary | ICD-10-CM | POA: Diagnosis not present

## 2016-12-17 DIAGNOSIS — R5383 Other fatigue: Secondary | ICD-10-CM | POA: Diagnosis not present

## 2016-12-24 DIAGNOSIS — L821 Other seborrheic keratosis: Secondary | ICD-10-CM | POA: Diagnosis not present

## 2016-12-24 DIAGNOSIS — L570 Actinic keratosis: Secondary | ICD-10-CM | POA: Diagnosis not present

## 2016-12-24 DIAGNOSIS — Z85828 Personal history of other malignant neoplasm of skin: Secondary | ICD-10-CM | POA: Diagnosis not present

## 2016-12-24 DIAGNOSIS — H2513 Age-related nuclear cataract, bilateral: Secondary | ICD-10-CM | POA: Diagnosis not present

## 2016-12-24 DIAGNOSIS — D225 Melanocytic nevi of trunk: Secondary | ICD-10-CM | POA: Diagnosis not present

## 2017-01-28 ENCOUNTER — Ambulatory Visit: Payer: Medicare Other | Admitting: Neurology

## 2017-02-11 DIAGNOSIS — G4733 Obstructive sleep apnea (adult) (pediatric): Secondary | ICD-10-CM | POA: Diagnosis not present

## 2017-02-11 DIAGNOSIS — J342 Deviated nasal septum: Secondary | ICD-10-CM | POA: Diagnosis not present

## 2017-02-11 DIAGNOSIS — Z9989 Dependence on other enabling machines and devices: Secondary | ICD-10-CM | POA: Diagnosis not present

## 2017-02-11 DIAGNOSIS — R42 Dizziness and giddiness: Secondary | ICD-10-CM | POA: Diagnosis not present

## 2017-02-11 DIAGNOSIS — R0981 Nasal congestion: Secondary | ICD-10-CM | POA: Diagnosis not present

## 2017-02-13 ENCOUNTER — Ambulatory Visit (INDEPENDENT_AMBULATORY_CARE_PROVIDER_SITE_OTHER): Payer: Medicare Other | Admitting: Orthopaedic Surgery

## 2017-02-13 ENCOUNTER — Encounter (INDEPENDENT_AMBULATORY_CARE_PROVIDER_SITE_OTHER): Payer: Self-pay | Admitting: Orthopaedic Surgery

## 2017-02-13 VITALS — BP 151/70 | HR 55 | Resp 16 | Ht 70.0 in | Wt 220.0 lb

## 2017-02-13 DIAGNOSIS — M7541 Impingement syndrome of right shoulder: Secondary | ICD-10-CM

## 2017-02-13 DIAGNOSIS — G8929 Other chronic pain: Secondary | ICD-10-CM

## 2017-02-13 DIAGNOSIS — M25511 Pain in right shoulder: Secondary | ICD-10-CM

## 2017-02-13 MED ORDER — BUPIVACAINE HCL 0.5 % IJ SOLN
2.0000 mL | INTRAMUSCULAR | Status: AC | PRN
  Administered 2017-02-13: 2 mL via INTRA_ARTICULAR

## 2017-02-13 MED ORDER — LIDOCAINE HCL 1 % IJ SOLN
2.0000 mL | INTRAMUSCULAR | Status: AC | PRN
  Administered 2017-02-13: 2 mL

## 2017-02-13 MED ORDER — METHYLPREDNISOLONE ACETATE 40 MG/ML IJ SUSP
80.0000 mg | INTRAMUSCULAR | Status: AC | PRN
  Administered 2017-02-13: 80 mg

## 2017-02-13 NOTE — Progress Notes (Signed)
Office Visit Note   Patient: Nathan Delgado           Date of Birth: 24-Apr-1941           MRN: [id number] Visit Date: 02/13/2017              Requested by: Leanna Battles, MD [address] [place], [place] [address] PCP: Donnajean Lopes, MD   Assessment & Plan: Visit Diagnoses:  1. Impingement syndrome of right shoulder   2. Chronic right shoulder pain     Plan:  #1: Subacromial corticosteroid injection to the right shoulder #2: If this is not beneficial he'll need to give [place] a call and we'll schedule an MRI scan of the right shoulder.  Follow-Up Instructions: Return if symptoms worsen or fail to improve.   Orders:  No orders of the defined types were placed in this encounter.  No orders of the defined types were placed in this encounter.     Procedures: Large Joint Inj Date/Time: 02/13/2017 2:52 PM Performed by: Biagio Borg D Authorized by: Biagio Borg D   Consent Given by:  Patient Timeout: prior to procedure the correct patient, procedure, and site was verified   Indications:  Pain Location:  Shoulder Site:  L subacromial bursa Prep: patient was prepped and draped in usual sterile fashion   Needle Size:  25 G Needle Length:  1.5 inches Approach:  Lateral Ultrasound Guidance: No   Fluoroscopic Guidance: No   Arthrogram: No   Medications:  80 mg methylPREDNISolone acetate 40 MG/ML; 2 mL lidocaine 1 %; 2 mL bupivacaine 0.5 % Aspiration Attempted: No   Patient tolerance:  Patient tolerated the procedure well with no immediate complications     Clinical Data: No additional findings.   Subjective: Chief Complaint  Patient presents with  . Right Shoulder - Pain    Nathan Delgado is a  76 year old white male who is seen today for evaluation of his right shoulder. He states that his pain today in the right shoulder is very similar to his shoulder pain prior to his last injection in 10/11/2017. Which was markedly improved by corticosteroid injection.   He has been going to a trainer 3 x week for 7 years. He does not raise his arm above his head. He tends to do most of his workout below shoulder level in the upper extremities. He states his pain starts in the shoulder and radiates down and stops above the elbow. He would like an injection today as it helped the right shoulder , last one 10/11/17.    Review of Systems  Constitutional: Negative.   HENT: Negative.   Eyes: Negative.   Respiratory: Positive for apnea.        CPAP  Cardiovascular: Negative.   Gastrointestinal: Negative.   Endocrine: Negative.   Genitourinary: Negative.   Musculoskeletal: Negative.   Skin: Negative.   Allergic/Immunologic: Negative.   Neurological: Negative.   Hematological: Negative.   Psychiatric/Behavioral: Negative.      Objective: Vital Signs: BP (!) 151/70   Pulse (!) 55   Resp 16   Ht 5\' 10"  (1.778 m)   Wt 220 lb (99.8 kg)   BMI 31.57 kg/m   Physical Exam  Constitutional: He is oriented to person, place, and time. He appears well-developed and well-nourished.  HENT:  Head: Normocephalic and atraumatic.  Eyes: EOM are normal. Pupils are equal, round, and reactive to light.  Neck:  No carotid bruits  Pulmonary/Chest: Effort normal.  Neurological: He is  alert and oriented to person, place, and time.  Skin: Skin is warm and dry.  Psychiatric: He has a normal mood and affect. His behavior is normal. Judgment and thought content normal.    Right Shoulder Exam   Range of Motion  Active Abduction: 120  Passive Abduction: 160  Forward Flexion: 150  External Rotation: 70  Internal Rotation 90 degrees: 70   Tests  Apprehension: positive  Other  Sensation: normal Pulse: present  Comments:  Good strength      Specialty Comments:  No specialty comments available.  Imaging: No results found.   PMFS History: Patient Active Problem List   Diagnosis Date Noted  . 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 S/P laparoscopic  appendectomy 10/20/2016  . 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 Fibromyalgia 04/23/2015   Past Medical History:  Diagnosis Date  . Allergic rhinitis   . Difficult intubation 10/20/2106  . ED (erectile dysfunction)   . Fibromyalgia   . GERD (gastroesophageal reflux disease)   . Hypertension   . Lateral epicondylitis   . Paronychia   . 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 Cancer Mother   . Dementia Father   . Depression Father   . Macular degeneration Father   . Neuropathy Neg Hx     Past Surgical History:  Procedure Laterality Date  . CATARACT EXTRACTION, BILATERAL    . INGUINAL HERNIA REPAIR    . KNEE ARTHROSCOPY    . LAPAROSCOPIC APPENDECTOMY N/A 10/20/2016   Procedure: APPENDECTOMY LAPAROSCOPIC;  Surgeon: Greer Pickerel, MD;  Location: [facility];  Service: General;  Laterality: N/A;  . TONSILLECTOMY     Social History   Occupational History  . Retired     Social History Main Topics  . Smoking status: Former Research scientist (life sciences)  . Smokeless tobacco: Former Systems developer    Quit date: 10/21/2016  . Alcohol use 0.0 oz/week     Comment: 2 drinks per day  . Drug use: No  . Sexual activity: Not Currently    Birth control/ protection: Abstinence

## 2017-02-27 DIAGNOSIS — Z125 Encounter for screening for malignant neoplasm of prostate: Secondary | ICD-10-CM | POA: Diagnosis not present

## 2017-02-27 DIAGNOSIS — I10 Essential (primary) hypertension: Secondary | ICD-10-CM | POA: Diagnosis not present

## 2017-03-06 DIAGNOSIS — N528 Other male erectile dysfunction: Secondary | ICD-10-CM | POA: Diagnosis not present

## 2017-03-06 DIAGNOSIS — Z6832 Body mass index (BMI) 32.0-32.9, adult: Secondary | ICD-10-CM | POA: Diagnosis not present

## 2017-03-06 DIAGNOSIS — Z0000 Encounter for general adult medical examination without abnormal findings: Secondary | ICD-10-CM | POA: Diagnosis not present

## 2017-03-06 DIAGNOSIS — G4733 Obstructive sleep apnea (adult) (pediatric): Secondary | ICD-10-CM | POA: Diagnosis not present

## 2017-03-06 DIAGNOSIS — I10 Essential (primary) hypertension: Secondary | ICD-10-CM | POA: Diagnosis not present

## 2017-03-06 DIAGNOSIS — F418 Other specified anxiety disorders: Secondary | ICD-10-CM | POA: Diagnosis not present

## 2017-03-06 DIAGNOSIS — Z1389 Encounter for screening for other disorder: Secondary | ICD-10-CM | POA: Diagnosis not present

## 2017-03-07 DIAGNOSIS — Z1212 Encounter for screening for malignant neoplasm of rectum: Secondary | ICD-10-CM | POA: Diagnosis not present

## 2017-03-13 DIAGNOSIS — R195 Other fecal abnormalities: Secondary | ICD-10-CM | POA: Diagnosis not present

## 2017-03-25 ENCOUNTER — Encounter (INDEPENDENT_AMBULATORY_CARE_PROVIDER_SITE_OTHER): Payer: Self-pay

## 2017-03-25 ENCOUNTER — Ambulatory Visit (INDEPENDENT_AMBULATORY_CARE_PROVIDER_SITE_OTHER): Payer: Medicare Other | Admitting: Neurology

## 2017-03-25 ENCOUNTER — Encounter: Payer: Self-pay | Admitting: Neurology

## 2017-03-25 VITALS — BP 120/62 | HR 72 | Resp 20 | Ht 70.0 in | Wt 228.0 lb

## 2017-03-25 DIAGNOSIS — Z9989 Dependence on other enabling machines and devices: Secondary | ICD-10-CM

## 2017-03-25 DIAGNOSIS — G4733 Obstructive sleep apnea (adult) (pediatric): Secondary | ICD-10-CM | POA: Diagnosis not present

## 2017-03-25 NOTE — Patient Instructions (Signed)

## 2017-03-25 NOTE — Progress Notes (Signed)
Subjective:    Patient ID: Nathan Delgado is a 76 y.o. male.  HPI     Interim history:   Nathan Delgado is a 76 year old right-handed gentleman with an underlying medical history of allergic rhinitis, hypertension, fibromyalgia, reflux disease, ED, anxiety and obesity, who presents for follow-up consultation of his OSA. The patient is unaccompanied today. I last saw him on 09/24/2016, at which time he reported doing fairly well, was adjusting to treatment and motivated to continue with CPAP therapy. He was compliant with treatment. His blood pressure improved after change in his medication. He denied any restless leg symptoms but did endorse hip pain when he was here for the sleep studies.  Today, 03/25/2017 (all dictated new, as well as above notes, some dictation done in note pad or Word, outside of chart, may appear as copied):  I reviewed his CPAP compliance data from 02/22/2017 through 03/23/2017, which is a total of 30 days, during which time he used his CPAP every night with percent used days greater than 4 hours at 100%, indicating superb compliance with an average usage of 8 hours and 2 minutes, residual AHI 0.9 per hour, leak acceptable with the 95th percentile at 8.9 L/m on a pressure of 10 cm with EPR of 3. He reports doing well with CPAP, working well for him, tolerating the interface and pressure. Has had appendicitis and had AE on 10/20/16. He has bouts of anxiety for which he takes tranxene as needed. He works out 3 days a week with a Physiological scientist, Annice Pih. His sleep is less interrupted. Nocturia is later at night now, rather than earlier in the night. Has Received supplies on a regular basis. Has no new complaints with the exception that he has balance problems and feeling of dizziness, he has had workup for this, has seen ENT, has had his eyes checked, has no significant positional vertigo. He feels as if he has fullness in his head. He has a longer standing history of anxiety which  is managed with Wellbutrin once daily and as needed Tranxene.   The patient's allergies, current medications, family history, past medical history, past social history, past surgical history and problem list were reviewed and updated as appropriate.   Previously (copied from previous notes for reference):   I first met him on 05/21/2016 at the request of his primary care physician, at which time he reported nonrestorative sleep, sleep disruption, and nocturia. I invited him for a sleep study. He had a baseline sleep study followed by a CPAP titration study. I went over his test results with him in detail today. His baseline sleep study on 06/10/2016 showed a sleep efficiency of 78.4% with a latency to sleep of 17 minutes and wake after sleep onset of 83 minutes with moderate sleep fragmentation noted. He had an elevated arousal index. He had absence of slow-wave sleep and REM sleep was 40.5% with a normal REM latency. He had PLMS with an index of 25.5 per hour, resulting in only 2.7 arousals per hour. He had mild to moderate snoring. Supine REM sleep was not achieved. Total AHI was 12.6 per hour, rising to 22.9 per hour during REM sleep. Average oxygen saturation was 92%, nadir was 81%. Time below 90% saturation was 47 minutes. Based on his test results and his sleep related complaints I invited him for a full night CPAP titration study. He had this on 07/01/2016. Sleep efficiency was 83.4%, sleep latency 13.5 minutes, wake after sleep onset was 51  minutes with mild to moderate sleep fragmentation noted. He had a high normal arousal index. He had absence of slow-wave sleep, REM sleep was 18.9% with a REM latency of 69 minutes. He had very mild PLMS with an index of 12.4 per hour, resulting in 3.5 arousals per hour. CPAP was started at 5 cm using a nasal mask. CPAP was titrated to 13 cm. AHI was 1.4 per hour at a pressure of 10 cm. Brief supine REM sleep was achieved. Based on his test results are prescribed  CPAP therapy for home use.   I reviewed his CPAP compliance data from 08/24/2016 through 09/22/2016 which is a total of 30 days, during which time he used his machine every night with percent used days greater than 4 hours at 100%, indicating superb compliance with an average usage of 8 hours and 20 minutes, residual AHI 1.4 per hour, leak low on the higher end of normal with the 95th percentile at 21.6 L per min and a pressure of 10 cm with EPR of 3.    05/21/2016: He reports sleep disruption, nocturia and non-restorative sleep. He does not know if he snores. He is divorced (x2) and lives alone. He reports difficulty initiating and maintaining sleep. I reviewed your office note from 05/06/2016, which you kindly included.  He goes to the gym and works with a Psychologist, educational, M/W/F. He has 2 grown children, 4 grandchildren. He is retired. He quit smoking in 1983. He drinks about 2 alcoholic beverages per day, coffee 1-2 cups per day. He goes to bed around 11 PM and watches TV, puts it on a timer. Usually turns the TV off before the time and turns it off. He falls asleep fairly quickly. Until about a month ago he had significant sleep disruption, particularly from having to go to the bathroom 3-4 times on an average night. Since his recent medication changes he has had improvement in his sleep disruption and nocturia and gets up on average once per night. His wake up time is around 7:30 to 8 AM. He feels adequately or reasonably rested when he first wakes up. He denies restless leg symptoms or morning headaches. He does not take any naps. Sometimes when he comes home after working out with a trainer he doses off briefly. His recent medication changes include discontinuation of amlodipine and Lyrica, reduction in Wellbutrin, new blood pressure medication and he was then restarted on low-dose Lyrica, currently at 50 mg twice daily with good tolerance. He had some intermittent tingling which have improved. He has some  symptoms in keeping with a carpal tunnel syndrome. His weight has been stable for the past 5-7 years. Epworth Sleepiness Scale score is 2 out of 24 today, his fatigue score is 11 out of 63.    His Past Medical History Is Significant For: Past Medical History:  Diagnosis Date  . Allergic rhinitis   . Difficult intubation 10/20/2106  . ED (erectile dysfunction)   . Fibromyalgia   . GERD (gastroesophageal reflux disease)   . Hypertension   . Lateral epicondylitis   . Paronychia   . Purulent appendicitis 10/23/2016    His Past Surgical History Is Significant For: Past Surgical History:  Procedure Laterality Date  . CATARACT EXTRACTION, BILATERAL    . INGUINAL HERNIA REPAIR    . KNEE ARTHROSCOPY    . LAPAROSCOPIC APPENDECTOMY N/A 10/20/2016   Procedure: APPENDECTOMY LAPAROSCOPIC;  Surgeon: Gaynelle Adu, MD;  Location: [facility];  Service: General;  Laterality: N/A;  .  TONSILLECTOMY      His Family History Is Significant For: Family History  Problem Relation Age of Onset  . Cancer Mother   . Dementia Father   . Depression Father   . Macular degeneration Father   . Neuropathy Neg Hx     His Social History Is Significant For: Social History   Social History  . Marital status: Divorced    Spouse name: N/A  . Number of children: 2  . Years of education: College   Occupational History  . Retired     Social History Main Topics  . Smoking status: Former Research scientist (life sciences)  . Smokeless tobacco: Former Systems developer    Quit date: 10/21/2016  . Alcohol use 0.0 oz/week     Comment: 2 drinks per day  . Drug use: No  . Sexual activity: Not Currently    Birth control/ protection: Abstinence   Other Topics Concern  . None   Social History Narrative   Lives at home    Caffeine use: 2 cup coffee per day     His Allergies Are:  No Known Allergies:   His Current Medications Are:  Outpatient Encounter Prescriptions as of 03/25/2017  Medication Sig  . acetaminophen (TYLENOL) 325 MG tablet You can  take up to 4000 mg of this per day.  Marland Kitchen aspirin 81 MG chewable tablet Chew by mouth.  Marland Kitchen buPROPion (WELLBUTRIN SR) 150 MG 12 hr tablet Take 150 mg by mouth daily.   Marland Kitchen buPROPion (WELLBUTRIN SR) 150 MG 12 hr tablet   . clorazepate (TRANXENE) 3.75 MG tablet Take 3.75 mg by mouth 2 (two) times daily as needed for anxiety.  . Fish Oil-Cholecalciferol (OMEGA-3 + VITAMIN D3 PO) Take by mouth.  . losartan-hydrochlorothiazide (HYZAAR) 100-12.5 MG tablet   . Multiple Vitamin (MULTIVITAMIN) tablet Take 1 tablet by mouth daily.  Marland Kitchen omeprazole (PRILOSEC) 20 MG capsule Take by mouth.  . oxyCODONE (OXY IR/ROXICODONE) 5 MG immediate release tablet Use for pain not relieved by Tylenol.  . polyethylene glycol (MIRALAX / GLYCOLAX) packet You can use this as needed for constipation.  Follow package instructions.  If you use the fiber supplement, it should relieve most of your chronic constipation issues.  You can buy this over the counter.  . psyllium (HYDROCIL/METAMUCIL) 95 % PACK Use package instructions, and adjust so you have a soft bowel movement daily.  You can buy over the counter.  . saccharomyces boulardii (FLORASTOR) 250 MG capsule You can buy this over the counter at any drug store, I would take it for a month and then discontinue it.  . [DISCONTINUED] ALPRAZolam (XANAX) 0.25 MG tablet Take 0.25 mg by mouth daily as needed for anxiety.   . [DISCONTINUED] amoxicillin-clavulanate (AUGMENTIN) 875-125 MG tablet Take 1 tablet by mouth every 12 (twelve) hours.   No facility-administered encounter medications on file as of 03/25/2017.   :  Review of Systems:  Out of a complete 14 point review of systems, all are reviewed and negative with the exception of these symptoms as listed below: Review of Systems  Neurological:       Patient states that he is doing well with CPAP. No new concerns.     Objective:  Neurologic Exam  Physical Exam Physical Examination:   Vitals:   03/25/17 1341  BP: 120/62  Pulse:  72  Resp: 20   General Examination: The patient is a very pleasant 76 y.o. male in no acute distress. He appears well-developed and well-nourished and well groomed.  HEENT: Normocephalic, atraumatic, pupils are equal, round and reactive to light and accommodation. Extraocular tracking is good without limitation to gaze excursion or nystagmus noted. Normal smooth pursuit is noted. Hearing is mildly impaired. Face is symmetric with normal facial animation and normal facial sensation. Speech is clear with no dysarthria noted. There is no hypophonia. There is no lip, neck/head, jaw or voice tremor. Neck is supple with full range of passive and active motion. There are no carotid bruits on auscultation. Oropharynx exam reveals: mild mouth dryness, adequate dental hygiene and mild airway crowding. Mallampati is class II. Tongue protrudes centrally and palate elevates symmetrically. Tonsils are absent.   Chest: Clear to auscultation without wheezing, rhonchi or crackles noted.  Heart: S1+S2+0, regular and normal without murmurs, rubs or gallops noted.   Abdomen: Soft, non-tender and non-distended with normal bowel sounds appreciated on auscultation.  Extremities: There is no pitting edema in the distal lower extremities bilaterally. Pedal pulses are intact.  Skin: Warm and dry without trophic changes noted.  Musculoskeletal: exam reveals no obvious joint deformities, tenderness or joint swelling or erythema.   Neurologically:  Mental status: The patient is awake, alert and oriented in all 4 spheres. His immediate and remote memory, attention, language skills and fund of knowledge are appropriate. There is no evidence of aphasia, agnosia, apraxia or anomia. Speech is clear with normal prosody and enunciation. Thought process is linear. Mood is normal and affect is normal.  Cranial nerves II - XII are as described above under HEENT exam. In addition: shoulder shrug is normal with equal shoulder height  noted. Motor exam: Normal bulk, strength and tone is noted. There is no drift, tremor or rebound. Romberg is negative. Reflexes are 1+ throughout. Fine motor skills and coordination: intact.  Cerebellar testing: No dysmetria or intention tremor. There is no truncal or gait ataxia.  Sensory exam: intact to light touch in the upper and lower extremities.  Gait, station and balance: He stands easily. No veering to one side is noted. No leaning to one side is noted. Posture is age-appropriate and stance is narrow based. Gait shows normal stride length and pace, no veering to one side.              Assessment and Plan:  In summary, KEVONTAY BURKS is a very pleasant 76 y.o.-year old male with an underlying medical history of allergic rhinitis, hypertension, fibromyalgia, reflux disease, ED, anxiety and obesity, who presents for follow-up consultation of his mild to moderate obstructive sleep apnea, Well established on CPAP therapy with full compliance. He has had reasonably good results. He is commended for his treatment adherence. We briefly talked about his baseline sleep study from July 2017 and his CPAP titration study from August 2017 and comparison again today. We reviewed his compliance data as well. He is fully compliant with treatment. His physical exam is stable. He has had a longer standing history of intermittent balance problems and nonspecific feeling of dizziness. He has seen ENT for this. He does not believe this is medication related. He has been on Wellbutrin for years. He has been on as needed tranxene for his anxiety for years. His neurological exam is nonfocal. I did talk to him about potentially doing a brain MRI. He would like to hold off and discuss with his PCP as well. From a sleep apnea standpoint I suggested a one-year checkup. He denies any restless leg symptoms. He can see one of our nurse practitioners at his next visit. He  is up-to-date with his supplies. I answered all his  questions today and he was in agreement with the plan.  I spent 20 minutes in total face-to-face time with the patient, more than 50% of which was spent in counseling and coordination of care, reviewing test results, reviewing medication and discussing or reviewing the diagnosis of OSA, its prognosis and treatment options. Pertinent laboratory and imaging test results that were available during this visit with the patient were reviewed by me and considered in my medical decision making (see chart for details).

## 2017-05-05 ENCOUNTER — Ambulatory Visit (INDEPENDENT_AMBULATORY_CARE_PROVIDER_SITE_OTHER): Payer: Medicare Other | Admitting: Orthopaedic Surgery

## 2017-05-05 ENCOUNTER — Encounter (INDEPENDENT_AMBULATORY_CARE_PROVIDER_SITE_OTHER): Payer: Self-pay | Admitting: Orthopaedic Surgery

## 2017-05-05 VITALS — BP 101/51 | HR 67 | Resp 12 | Ht 70.0 in | Wt 228.0 lb

## 2017-05-05 DIAGNOSIS — G8929 Other chronic pain: Secondary | ICD-10-CM | POA: Diagnosis not present

## 2017-05-05 DIAGNOSIS — M25511 Pain in right shoulder: Secondary | ICD-10-CM | POA: Diagnosis not present

## 2017-05-05 NOTE — Progress Notes (Signed)
Office Visit Note   Patient: Nathan Delgado           Date of Birth: 25-Dec-1940           MRN: [id number] Visit Date: 05/05/2017              Requested by: Leanna Battles, MD [place], [place] [address] PCP: Leanna Battles, MD   Assessment & Plan: Visit Diagnoses:  1. 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 MR SHOULDER RIGHT WO CONTRAST   No orders of the defined types were placed in this encounter.     Procedures: No procedures performed   Clinical Data: No additional findings.   Subjective: Chief Complaint  Patient presents with  . Right Shoulder - Pain    Mr. Nathan Delgado is a 76 y o that is here for same R shoulder pain. He recently went to have a massage and she "felt" a knot where his pain is. Good overhead ROM Received a cortisone injection 02/13/17 and worked well until 2 wks ago. Pt also goes to gym x  Right shoulder injection 2-1/2 months ago with initial relief of his pain only to have it recur. Nathan Delgado is active in the gym and is having some trouble on the anterior lateral subacromial region. He denies history of injury or trauma. Does not have any trouble sleeping.  HPI  Review of Systems   Objective: Vital Signs: BP (!) 101/51   Pulse 67   Resp 12   Ht 5\' 10"  (1.778 m)   Wt 228 lb (103.4 kg)   BMI 32.71 kg/m   Physical Exam  Ortho Exam right shoulder with minimally positive impingement. Positive empty can testing. Full overhead motion with quick flexion. A little bit of weakness with external rotation. Not with internal rotation. Mild anterior shoulder tenderness not at the acromioclavicular joint. Skin intact. Neurovascular exam intact.   Specialty Comments:  No specialty comments available.  Imaging: No results found.   PMFS History: Patient Active Problem List   Diagnosis Date  Noted  . 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 S/P laparoscopic appendectomy 10/20/2016  . 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 Fibromyalgia 04/23/2015   Past Medical History:  Diagnosis Date  . Allergic rhinitis   . Difficult intubation 10/20/2106  . ED (erectile dysfunction)   . Fibromyalgia   . GERD (gastroesophageal reflux disease)   . Hypertension   . Lateral epicondylitis   . Paronychia   . 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 Cancer Mother   . Dementia Father   . Depression Father   . Macular degeneration Father   . Neuropathy Neg Hx     Past Surgical History:  Procedure Laterality Date  . CATARACT EXTRACTION, BILATERAL    . INGUINAL HERNIA REPAIR    . KNEE ARTHROSCOPY    . LAPAROSCOPIC APPENDECTOMY N/A 10/20/2016   Procedure: APPENDECTOMY LAPAROSCOPIC;  Surgeon: Greer Pickerel, MD;  Location: [facility];  Service: General;  Laterality: N/A;  . TONSILLECTOMY     Social History   Occupational History  . Retired     Social History Main Topics  . Smoking status: Former Research scientist (life sciences)  . Smokeless tobacco: Former Systems developer    Quit date: 10/21/2016  . Alcohol use 0.0 oz/week     Comment: 2  drinks per day  . Drug use: No  . Sexual activity: Not Currently    Birth control/ protection: Abstinence     Nathan Balding, MD   Note - This record has been created using [employer].  Chart creation errors have been sought, but may not always  have been located. Such creation errors do not reflect on  the standard of medical care.

## 2017-05-20 ENCOUNTER — Ambulatory Visit
Admission: RE | Admit: 2017-05-20 | Discharge: 2017-05-20 | Disposition: A | Discharge status: Home / Self Care | Payer: Medicare Other | Source: Ambulatory Visit | Attending: Orthopaedic Surgery | Admitting: Orthopaedic Surgery

## 2017-05-20 DIAGNOSIS — M25511 Pain in right shoulder: Principal | ICD-10-CM

## 2017-05-20 DIAGNOSIS — G8929 Other chronic pain: Secondary | ICD-10-CM

## 2017-05-20 DIAGNOSIS — M75101 Unspecified rotator cuff tear or rupture of right shoulder, not specified as traumatic: Secondary | ICD-10-CM | POA: Diagnosis not present

## 2017-05-29 ENCOUNTER — Other Ambulatory Visit (INDEPENDENT_AMBULATORY_CARE_PROVIDER_SITE_OTHER): Payer: Self-pay

## 2017-05-29 ENCOUNTER — Telehealth (INDEPENDENT_AMBULATORY_CARE_PROVIDER_SITE_OTHER): Payer: Self-pay | Admitting: Orthopaedic Surgery

## 2017-05-29 NOTE — Telephone Encounter (Signed)
Pt will call back to receive exercises for shoulder. He goes to a Physiological scientist  3 x week Alfonse Spruce)

## 2017-05-29 NOTE — Patient Instructions (Signed)
Elbow Bursitis Rehab Ask your health care provider which exercises are safe for you. Do exercises exactly as told by your health care provider and adjust them as directed. It is normal to feel mild stretching, pulling, tightness, or discomfort as you do these exercises, but you should stop right away if you feel sudden pain or your pain gets worse. Do not begin these exercises until told by your health care provider. Stretching and range of motion exercises These exercises warm up your muscles and joints and improve the movement and flexibility of your elbow. These exercises also help to relieve pain and swelling. Exercise A: Passive elbow flexion  1. Lie on your back. 2. Extend your left / right arm up into the air, bracing it with your other hand. Allow your left / right arm to relax. 3. Let your left / right elbow bend, allowing your hand to fall slowly toward your chest. You should feel a gentle stretch along the back of your upper arm and elbow. 4. If told by your health care provider, hold a __________ hand weight to increase the intensity of this stretch. 5. Hold this position for __________ seconds. 6. Slowly return your left / right arm to the upright position. Repeat __________ times. Complete this exercise __________ times a day. Exercise B: Passive elbow extension  1. Lie on your back on a firm bed. Make sure that you are in a comfortable position that allows you relax your arm muscles. 2. Place a folded towel under your left / right upper arm so that your elbow and shoulder are at the same height. 3. Extend your left / right arm so your elbow and hand do not rest on the bed or towel. 4. Let the weight of your hand straighten your elbow. Keep your arm and chest muscles relaxed. You should feel a stretch on the inside of your elbow. 5. If told by your health care provider, increase the intensity of your stretch by adding a small wrist weight or hand weight. 6. Hold this position for  __________ seconds. 7. Slowly return to the starting position. Repeat __________ times. Complete this exercise __________ times a day. Strengthening exercises These exercises build strength and endurance in your elbow. Endurance is the ability to use your muscles for a long time, even after they get tired. Exercise C: Elbow extensors, isometric  1. Stand or sit upright on a firm surface. 2. Place your left / right arm so your palm faces your abdomen, at the height of your waist. 3. Place your other hand on the underside of your forearm. Slowly push your left / right arm down, like you were going to straighten your elbow. Resist with your other hand. Push as hard as you can without causing any pain or movement at your left / right elbow. 4. Hold this position for __________ seconds. 5. Slowly release the tension in both arms. 6. Let your muscles relax completely before repeating. Repeat __________ times. Complete this exercise __________ times a day. Exercise D: Elbow flexors, isometric 1. Stand or sit upright on a firm surface. 2. Place your left / right arm so your hand is palm-up, at the height of your waist. 3. Place your other hand on top of your forearm. Gently push up with your left / right arm, like you were going to bend your elbow. Resist this motion with your other hand. Push as hard as you can without causing any pain or movement at your left / right  elbow. 4. Hold this position for __________ seconds. 5. Slowly release the tension in both arms. 6. Let your muscles relax completely before repeating. Repeat __________ times. Complete this exercise __________ times a day. This information is not intended to replace advice given to you by your health care provider. Make sure you discuss any questions you have with your health care provider. Document Released: 11/11/2005 Document Revised: 07/16/2016 Document Reviewed: 08/10/2015 Elsevier Interactive Patient Education  2018 [place] is when the fluid-filled sac (bursa) that covers and protects a joint is swollen (inflamed). Bursitis is most common near joints, especially the knees, elbows, hips, and shoulders. Follow these instructions at home:  Take medicines only as told by your doctor.  If you were prescribed an antibiotic medicine, finish it all even if you start to feel better.  Rest the affected area as told by your doctor. ? Keep the area raised up. ? Avoid doing things that make the pain worse.  Apply ice to the injured area: ? Place ice in a plastic bag. ? Place a towel between your skin and the bag. ? Leave the ice on for 20 minutes, 2-3 times a day.  Use splints, braces, pads, or walking aids as told by your doctor.  Keep all follow-up visits as told by your doctor. This is important. Contact a doctor if:  You have more pain with home care.  You have a fever.  You have chills. This information is not intended to replace advice given to you by your health care provider. Make sure you discuss any questions you have with your health care provider. Document Released: 05/01/2010 Document Revised: 04/18/2016 Document Reviewed: 01/31/2014 Elsevier Interactive Patient Education  2018 [employer].

## 2017-05-29 NOTE — Telephone Encounter (Signed)
Patient called wanting to know if the MRI covered his whole shoulder including the armpit because he is still having some discomfort in the area and also what exercises does he need to be doing to strengthen the bursa.  He wants to know if he still needs to keep his appointment for tomorrow.  CB#[phone].

## 2017-05-29 NOTE — Telephone Encounter (Signed)
Faxed info to number provided

## 2017-05-29 NOTE — Telephone Encounter (Signed)
Patient calling you back with info you requested. [phone] fax# Alfonse Spruce, Physiological scientist.

## 2017-05-29 NOTE — Telephone Encounter (Signed)
Did cover there armpit. Needs PT-please schedule. Office in 3-4 weeks if no improvement

## 2017-05-29 NOTE — Telephone Encounter (Signed)
Please advise 

## 2017-05-30 ENCOUNTER — Ambulatory Visit (INDEPENDENT_AMBULATORY_CARE_PROVIDER_SITE_OTHER): Payer: Medicare Other | Admitting: Orthopaedic Surgery

## 2017-05-30 NOTE — Telephone Encounter (Signed)
Patient left a message stating that his personal trainer has not received a fax from Dr. Durward Fortes as to what the patient is needing to do.  Please advise pt.  Thank you.

## 2017-06-02 ENCOUNTER — Telehealth (INDEPENDENT_AMBULATORY_CARE_PROVIDER_SITE_OTHER): Payer: Self-pay | Admitting: Orthopaedic Surgery

## 2017-06-02 NOTE — Telephone Encounter (Signed)
Patient would like to call him.  CB#[phone].  Thank you.

## 2017-06-02 NOTE — Telephone Encounter (Signed)
Please call.

## 2017-06-02 NOTE — Telephone Encounter (Signed)
Fax [phone] never received info for shoulder exercises, please re fax to this number. OR, call pt to pick up exercise sheets

## 2017-06-10 DIAGNOSIS — L821 Other seborrheic keratosis: Secondary | ICD-10-CM | POA: Diagnosis not present

## 2017-06-10 DIAGNOSIS — D1801 Hemangioma of skin and subcutaneous tissue: Secondary | ICD-10-CM | POA: Diagnosis not present

## 2017-06-10 DIAGNOSIS — L570 Actinic keratosis: Secondary | ICD-10-CM | POA: Diagnosis not present

## 2017-06-10 DIAGNOSIS — D225 Melanocytic nevi of trunk: Secondary | ICD-10-CM | POA: Diagnosis not present

## 2017-08-07 DIAGNOSIS — Z23 Encounter for immunization: Secondary | ICD-10-CM | POA: Diagnosis not present

## 2017-09-30 DIAGNOSIS — H43813 Vitreous degeneration, bilateral: Secondary | ICD-10-CM | POA: Diagnosis not present

## 2017-09-30 DIAGNOSIS — H01004 Unspecified blepharitis left upper eyelid: Secondary | ICD-10-CM | POA: Diagnosis not present

## 2017-09-30 DIAGNOSIS — H04203 Unspecified epiphora, bilateral lacrimal glands: Secondary | ICD-10-CM | POA: Diagnosis not present

## 2017-09-30 DIAGNOSIS — H01001 Unspecified blepharitis right upper eyelid: Secondary | ICD-10-CM | POA: Diagnosis not present

## 2017-09-30 DIAGNOSIS — H01005 Unspecified blepharitis left lower eyelid: Secondary | ICD-10-CM | POA: Diagnosis not present

## 2017-09-30 DIAGNOSIS — H01002 Unspecified blepharitis right lower eyelid: Secondary | ICD-10-CM | POA: Diagnosis not present

## 2017-09-30 DIAGNOSIS — Z961 Presence of intraocular lens: Secondary | ICD-10-CM | POA: Diagnosis not present

## 2017-09-30 DIAGNOSIS — H16223 Keratoconjunctivitis sicca, not specified as Sjogren's, bilateral: Secondary | ICD-10-CM | POA: Diagnosis not present

## 2017-11-10 DIAGNOSIS — Z6833 Body mass index (BMI) 33.0-33.9, adult: Secondary | ICD-10-CM | POA: Diagnosis not present

## 2017-11-10 DIAGNOSIS — G4733 Obstructive sleep apnea (adult) (pediatric): Secondary | ICD-10-CM | POA: Diagnosis not present

## 2017-11-10 DIAGNOSIS — F418 Other specified anxiety disorders: Secondary | ICD-10-CM | POA: Diagnosis not present

## 2017-11-10 DIAGNOSIS — R42 Dizziness and giddiness: Secondary | ICD-10-CM | POA: Diagnosis not present

## 2017-11-10 DIAGNOSIS — I10 Essential (primary) hypertension: Secondary | ICD-10-CM | POA: Diagnosis not present

## 2017-11-21 ENCOUNTER — Telehealth (INDEPENDENT_AMBULATORY_CARE_PROVIDER_SITE_OTHER): Payer: Self-pay | Admitting: Orthopaedic Surgery

## 2017-11-21 NOTE — Telephone Encounter (Signed)
Suggest another cortisone injection

## 2017-11-21 NOTE — Telephone Encounter (Signed)
appt 11/28/16.

## 2017-11-21 NOTE — Telephone Encounter (Signed)
Please advise 

## 2017-11-21 NOTE — Telephone Encounter (Signed)
Patient called stating he still has pain in right shoulder and upper arm.  MRI showed bursitis in his shoulder.  Patient states Dr. Durward Fortes told him that an injection might be an option.  He wants to discuss this before making an appointment.  CB#[phone]  Thank you

## 2017-11-28 ENCOUNTER — Ambulatory Visit (INDEPENDENT_AMBULATORY_CARE_PROVIDER_SITE_OTHER): Payer: Medicare Other | Admitting: Orthopaedic Surgery

## 2017-11-28 ENCOUNTER — Encounter (INDEPENDENT_AMBULATORY_CARE_PROVIDER_SITE_OTHER): Payer: Self-pay | Admitting: Orthopaedic Surgery

## 2017-11-28 VITALS — BP 125/76 | HR 88 | Resp 14 | Ht 64.0 in | Wt 228.0 lb

## 2017-11-28 DIAGNOSIS — M25511 Pain in right shoulder: Secondary | ICD-10-CM | POA: Diagnosis not present

## 2017-11-28 DIAGNOSIS — G8929 Other chronic pain: Secondary | ICD-10-CM

## 2017-11-28 MED ORDER — LIDOCAINE HCL 2 % IJ SOLN
2.0000 mL | INTRAMUSCULAR | Status: AC | PRN
  Administered 2017-11-28: 2 mL

## 2017-11-28 MED ORDER — BUPIVACAINE HCL 0.5 % IJ SOLN
2.0000 mL | INTRAMUSCULAR | Status: AC | PRN
  Administered 2017-11-28: 2 mL via INTRA_ARTICULAR

## 2017-11-28 MED ORDER — METHYLPREDNISOLONE ACETATE 40 MG/ML IJ SUSP
80.0000 mg | INTRAMUSCULAR | Status: AC | PRN
  Administered 2017-11-28: 80 mg

## 2017-11-28 NOTE — Progress Notes (Signed)
Office Visit Note   Patient: Nathan Delgado           Date of Birth: 09/28/1941           MRN: [id number] Visit Date: 11/28/2017              Requested by: Leanna Battles, MD [place],  [id number] PCP: Leanna Battles, MD   Assessment & Plan: Visit Diagnoses:  1. Chronic right shoulder pain     Plan: Chronic impingement syndrome right shoulder as responded the past to subacromial cortisone injection. I'll plan on injecting that same area today. He has had a prior MRI scan of the right shoulder in June demonstrating subacromial and subdeltoid bursitis with some tendinosis of the infra and supraspinatus tendons without a tear  Follow-Up Instructions: Return if symptoms worsen or fail to improve.   Orders:  No orders of the defined types were placed in this encounter.  No orders of the defined types were placed in this encounter.     Procedures: Large Joint Inj: R subacromial bursa on 11/28/2017 12:01 PM Indications: pain and diagnostic evaluation Details: 25 G 1.5 in needle, anterolateral approach  Arthrogram: No  Medications: 2 mL lidocaine 2 %; 2 mL bupivacaine 0.5 %; 80 mg methylPREDNISolone acetate 40 MG/ML Consent was given by the patient. Immediately prior to procedure a time out was called to verify the correct patient, procedure, equipment, support staff and site/side marked as required. Patient was prepped and draped in the usual sterile fashion.       Clinical Data: No additional findings.   Subjective: Chief Complaint  Patient presents with  . Right Shoulder - Pain, Weakness    Nathan Delgado is a 77 y o here today for chronic Right shoulder pain. He said his shoulder feel "weak" but personal trainer says it isn't  Prior subacromial cortisone injection right shoulder in March that made a big difference for months. He's having her current symptoms. We suggested another cortisone injection today. He is exercising  HPI  Review of Systems    Constitutional: Negative for fatigue.  HENT: Positive for hearing loss.   Respiratory: Positive for apnea. Negative for chest tightness and shortness of breath.   Cardiovascular: Negative for chest pain, palpitations and leg swelling.  Gastrointestinal: Negative for blood in stool, constipation and diarrhea.  Genitourinary: Negative for difficulty urinating.  Musculoskeletal: Positive for myalgias. Negative for arthralgias, back pain, joint swelling, neck pain and neck stiffness.  Neurological: Positive for weakness. Negative for numbness and headaches.  Hematological: Does not bruise/bleed easily.  Psychiatric/Behavioral: Negative for sleep disturbance. The patient is not nervous/anxious.      Objective: Vital Signs: BP 125/76   Pulse 88   Resp 14   Ht 5\' 4"  (1.626 m)   Wt 228 lb (103.4 kg)   BMI 39.14 kg/m   Physical Exam  Ortho Exam awake alert and oriented 3. Comfortable sitting part. Positive impingement and empty can testing full overhead motion with slightly circuitous arc of motion. Good grip and good release.  Specialty Comments:  No specialty comments available.  Imaging: No results found.   PMFS History: Patient Active Problem List   Diagnosis Date Noted  . 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 S/P laparoscopic appendectomy 10/20/2016  . 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 Fibromyalgia 04/23/2015   Past Medical History:  Diagnosis Date  . Allergic rhinitis   . Apnea   . Difficult intubation 10/20/2106  . ED (erectile dysfunction)   .  Fibromyalgia   . GERD (gastroesophageal reflux disease)   . Hypertension   . Lateral epicondylitis   . Paronychia   . 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 Cancer Mother   . Dementia Father   . Depression Father   . Macular degeneration Father   . Neuropathy Neg Hx     Past Surgical History:  Procedure Laterality Date  . CATARACT EXTRACTION, BILATERAL    . INGUINAL  HERNIA REPAIR    . KNEE ARTHROSCOPY    . LAPAROSCOPIC APPENDECTOMY N/A 10/20/2016   Procedure: APPENDECTOMY LAPAROSCOPIC;  Surgeon: Greer Pickerel, MD;  Location: [facility];  Service: General;  Laterality: N/A;  . TONSILLECTOMY     Social History   Occupational History  . Occupation: Retired   Tobacco Use  . Smoking status: Former Research scientist (life sciences)  . Smokeless tobacco: Former Systems developer    Quit date: 10/21/2016  Substance and Sexual Activity  . Alcohol use: Yes    Alcohol/week: 0.0 oz    Comment: 2 drinks per day  . Drug use: No  . Sexual activity: Not Currently    Birth control/protection: Abstinence

## 2017-12-04 DIAGNOSIS — H16223 Keratoconjunctivitis sicca, not specified as Sjogren's, bilateral: Secondary | ICD-10-CM | POA: Diagnosis not present

## 2017-12-04 DIAGNOSIS — H01001 Unspecified blepharitis right upper eyelid: Secondary | ICD-10-CM | POA: Diagnosis not present

## 2017-12-04 DIAGNOSIS — H01002 Unspecified blepharitis right lower eyelid: Secondary | ICD-10-CM | POA: Diagnosis not present

## 2017-12-04 DIAGNOSIS — H524 Presbyopia: Secondary | ICD-10-CM | POA: Diagnosis not present

## 2017-12-04 DIAGNOSIS — H01004 Unspecified blepharitis left upper eyelid: Secondary | ICD-10-CM | POA: Diagnosis not present

## 2017-12-04 DIAGNOSIS — H43813 Vitreous degeneration, bilateral: Secondary | ICD-10-CM | POA: Diagnosis not present

## 2017-12-18 DIAGNOSIS — H15002 Unspecified scleritis, left eye: Secondary | ICD-10-CM | POA: Diagnosis not present

## 2017-12-19 DIAGNOSIS — R300 Dysuria: Secondary | ICD-10-CM | POA: Diagnosis not present

## 2018-02-24 ENCOUNTER — Ambulatory Visit (INDEPENDENT_AMBULATORY_CARE_PROVIDER_SITE_OTHER): Payer: Medicare Other | Admitting: Orthopaedic Surgery

## 2018-02-25 ENCOUNTER — Ambulatory Visit (INDEPENDENT_AMBULATORY_CARE_PROVIDER_SITE_OTHER): Payer: Medicare Other | Admitting: Orthopedic Surgery

## 2018-02-25 ENCOUNTER — Encounter (INDEPENDENT_AMBULATORY_CARE_PROVIDER_SITE_OTHER): Payer: Self-pay | Admitting: Orthopedic Surgery

## 2018-02-25 ENCOUNTER — Ambulatory Visit (INDEPENDENT_AMBULATORY_CARE_PROVIDER_SITE_OTHER): Payer: Medicare Other

## 2018-02-25 VITALS — BP 131/63 | HR 52 | Resp 18 | Ht 69.5 in | Wt 230.0 lb

## 2018-02-25 DIAGNOSIS — G8929 Other chronic pain: Secondary | ICD-10-CM

## 2018-02-25 DIAGNOSIS — M5442 Lumbago with sciatica, left side: Secondary | ICD-10-CM

## 2018-02-25 DIAGNOSIS — M5441 Lumbago with sciatica, right side: Secondary | ICD-10-CM

## 2018-02-25 NOTE — Progress Notes (Signed)
Office Visit Note   Patient: Nathan Delgado           Date of Birth: 1941-08-04           MRN: [id number] Visit Date: 02/25/2018              Requested by: Leanna Battles, MD [place], [place] [address] PCP: Leanna Battles, MD   Assessment & Plan: Visit Diagnoses:  1. Chronic bilateral low back pain with bilateral sciatica     Plan:  #1: At this time try to limit some of his activity. #2: Use of Motrin/Advil 2 tablets in the morning prior to his workout with food and 1 tablet evening with dinner #3: If his symptoms do not improve he will give [place] a call we will schedule an MRI scan of the lumbar spine.  Follow-Up Instructions: Return if symptoms worsen or fail to improve.   Orders:  Orders Placed This Encounter  Procedures  . XR HIP UNILAT W OR W/O PELVIS 2-3 VIEWS RIGHT  . XR Lumbar Spine 2-3 Views   No orders of the defined types were placed in this encounter.     Procedures: No procedures performed   Clinical Data: No additional findings.   Subjective: Chief Complaint  Patient presents with  . Lower Back - Pain  . Back Pain    Low back pain x 2 weeks, working out at gym - Physiological scientist, difficulty walking, burning down bil legs, IBU, Tylenol doesn't help much, no injury, no surgery to back, not diabetic    HPI  Jamond is a 77 year old white male who presents today with a 2-week history of low back pain.  Apparently he has a Physiological scientist and works out 3 times a week with a Physiological scientist that has been going on for about 8 years.  Apparently his trainer does put him through the paces and he has been having difficulty with low back pain and difficulty with walking at times.  He denies any bowel or bladder incontinence.  He does have some minor symptoms of some possible radiculopathy.  He does have some burning pain bilaterally.  Particular dermatome.  He states he has used ibuprofen intermittently.  Tylenol is not that beneficial for  him.   Review of Systems  Constitutional: Negative for activity change.  HENT: Negative for trouble swallowing.   Eyes: Negative for pain.  Respiratory: Negative for shortness of breath.   Cardiovascular: Negative for leg swelling.  Gastrointestinal: Negative for constipation.  Endocrine: Negative for cold intolerance.  Genitourinary: Negative for difficulty urinating.  Musculoskeletal: Positive for back pain and gait problem.  Skin: Negative for rash.  Allergic/Immunologic: Negative for food allergies.  Neurological: Negative for numbness.  Hematological: Does not bruise/bleed easily.  Psychiatric/Behavioral: Negative for sleep disturbance.     Objective: Vital Signs: BP 131/63 (BP Location: Right Arm, Patient Position: Sitting, Cuff Size: Normal)   Pulse (!) 52   Resp 18   Ht 5' 9.5" (1.765 m)   Wt 230 lb (104.3 kg)   BMI 33.48 kg/m   Physical Exam  Constitutional: He is oriented to person, place, and time. He appears well-developed and well-nourished.  HENT:  Head: Normocephalic and atraumatic.  Eyes: Pupils are equal, round, and reactive to light. EOM are normal.  Pulmonary/Chest: Effort normal.  Neurological: He is alert and oriented to person, place, and time.  Skin: Skin is warm and dry.  Psychiatric: He has a normal mood and affect. His behavior  is normal. Judgment and thought content normal.    Ortho Exam  Today he has negative straight leg raising bilaterally.  Good hip motion bilaterally.  Sensation is intact to light touch in the lower extremities.  Good strength overall in both legs bilateral symmetric.  Negative straight leg raising.  He does have some difficulty getting up from a chair and starting to walk but then straightens out.  Deep tendon reflexes were trace bilateral symmetric lower extremities.  Specialty Comments:  No specialty comments available.  Imaging: Xr Hip Unilat W Or W/o Pelvis 2-3 Views Right  Result Date: 02/25/2018 AP pelvis and  right hip reveal some patchiness in the femoral neck area bilaterally.  Sclerosing and ostitis pubis.  There is heterotopic calcification and spurring diffusely of the iliac crests.  Also off the pubic rami with some cystic changes noted.  Xr Lumbar Spine 2-3 Views  Result Date: 02/25/2018 2 view lumbar spine does reveal some at the L4 and L5.  Does have some degenerative changes in both SI joints.  Anterior spurring at L4 and L3 as well as L2.  May be a little retrolisthesis of 4 on 5.  L5 on S1 reveals narrowing of that disc space.  May have a little bit of retrolisthesis of L5 on S1.    PMFS History: Current Outpatient Medications  Medication Sig Dispense Refill  . acetaminophen (TYLENOL) 325 MG tablet You can take up to 4000 mg of this per day. (Patient taking differently: as needed. You can take up to 4000 mg of this per day.)    . aspirin 81 MG chewable tablet Chew by mouth.    Marland Kitchen buPROPion (WELLBUTRIN SR) 150 MG 12 hr tablet Take 150 mg by mouth daily.     . clorazepate (TRANXENE) 3.75 MG tablet Take 3.75 mg by mouth 2 (two) times daily as needed for anxiety.    . divalproex (DEPAKOTE) 125 MG DR tablet Take 125 mg by mouth at bedtime.  2  . Fish Oil-Cholecalciferol (OMEGA-3 + VITAMIN D3 PO) Take by mouth.    . losartan-hydrochlorothiazide (HYZAAR) 100-12.5 MG tablet     . Multiple Vitamin (MULTIVITAMIN) tablet Take 1 tablet by mouth daily.    Marland Kitchen omeprazole (PRILOSEC) 20 MG capsule Take by mouth.    Marland Kitchen buPROPion (WELLBUTRIN SR) 150 MG 12 hr tablet     . oxyCODONE (OXY IR/ROXICODONE) 5 MG immediate release tablet Use for pain not relieved by Tylenol. (Patient not taking: Reported on 05/05/2017) 30 tablet 0  . polyethylene glycol (MIRALAX / GLYCOLAX) packet You can use this as needed for constipation.  Follow package instructions.  If you use the fiber supplement, it should relieve most of your chronic constipation issues.  You can buy this over the counter. (Patient not taking: Reported on  02/25/2018) 14 each 0  . psyllium (HYDROCIL/METAMUCIL) 95 % PACK Use package instructions, and adjust so you have a soft bowel movement daily.  You can buy over the counter. (Patient not taking: Reported on 02/25/2018) 56 each   . saccharomyces boulardii (FLORASTOR) 250 MG capsule You can buy this over the counter at any drug store, I would take it for a month and then discontinue it. (Patient not taking: Reported on 02/25/2018)     No current facility-administered medications for this visit.     Patient Active Problem List   Diagnosis Date Noted  . 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 S/P laparoscopic appendectomy 10/20/2016  . 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 Fibromyalgia 04/23/2015  Past Medical History:  Diagnosis Date  . Allergic rhinitis   . Apnea   . Difficult intubation 10/20/2106  . ED (erectile dysfunction)   . Fibromyalgia   . GERD (gastroesophageal reflux disease)   . Hypertension   . Lateral epicondylitis   . Paronychia   . 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 Cancer Mother   . Dementia Father   . Depression Father   . Macular degeneration Father   . Neuropathy Neg Hx     Past Surgical History:  Procedure Laterality Date  . CATARACT EXTRACTION, BILATERAL    . INGUINAL HERNIA REPAIR    . KNEE ARTHROSCOPY    . LAPAROSCOPIC APPENDECTOMY N/A 10/20/2016   Procedure: APPENDECTOMY LAPAROSCOPIC;  Surgeon: Greer Pickerel, MD;  Location: [facility];  Service: General;  Laterality: N/A;  . TONSILLECTOMY     Social History   Occupational History  . Occupation: Retired   Tobacco Use  . Smoking status: Former Smoker    Packs/day: 1.00    Years: 15.00    Pack years: 15.00    Types: Cigarettes    Last attempt to quit: 1997    Years since quitting: 22.2  . Smokeless tobacco: Never Used  Substance and Sexual Activity  . Alcohol use: Yes    Alcohol/week: 8.4 oz    Types: 7 Glasses of wine, 7 Shots of liquor per week     Comment: 2 drinks per day  . Drug use: No  . Sexual activity: Not Currently    Birth control/protection: Abstinence

## 2018-02-27 ENCOUNTER — Telehealth (INDEPENDENT_AMBULATORY_CARE_PROVIDER_SITE_OTHER): Payer: Self-pay | Admitting: Orthopaedic Surgery

## 2018-02-27 NOTE — Telephone Encounter (Signed)
Patient left a voicemail stating that he had an appointment with Aaron Edelman on 02/25/18.  Patient states he has a couple of questions and requests a return call.

## 2018-03-01 ENCOUNTER — Encounter (INDEPENDENT_AMBULATORY_CARE_PROVIDER_SITE_OTHER): Payer: Self-pay | Admitting: Orthopaedic Surgery

## 2018-03-02 ENCOUNTER — Other Ambulatory Visit (INDEPENDENT_AMBULATORY_CARE_PROVIDER_SITE_OTHER): Payer: Self-pay | Admitting: Radiology

## 2018-03-02 ENCOUNTER — Other Ambulatory Visit (INDEPENDENT_AMBULATORY_CARE_PROVIDER_SITE_OTHER): Payer: Self-pay | Admitting: Orthopaedic Surgery

## 2018-03-02 DIAGNOSIS — M545 Low back pain: Principal | ICD-10-CM

## 2018-03-02 DIAGNOSIS — G8929 Other chronic pain: Secondary | ICD-10-CM

## 2018-03-02 MED ORDER — TRAMADOL HCL 50 MG PO TABS: ORAL_TABLET | ORAL | 0 refills | Status: DC

## 2018-03-02 NOTE — Telephone Encounter (Signed)
Tramadol 50 mg #30 1-2 tabs po bid prn

## 2018-03-02 NOTE — Progress Notes (Signed)
RI

## 2018-03-02 NOTE — Telephone Encounter (Signed)
PleASE call

## 2018-03-02 NOTE — Telephone Encounter (Signed)
Notified pt meds were ready for pick up and MRI l spine was ordered

## 2018-03-02 NOTE — Telephone Encounter (Signed)
PLEASE CALL PT

## 2018-03-05 DIAGNOSIS — I10 Essential (primary) hypertension: Secondary | ICD-10-CM | POA: Diagnosis not present

## 2018-03-05 DIAGNOSIS — Z125 Encounter for screening for malignant neoplasm of prostate: Secondary | ICD-10-CM | POA: Diagnosis not present

## 2018-03-05 DIAGNOSIS — R82998 Other abnormal findings in urine: Secondary | ICD-10-CM | POA: Diagnosis not present

## 2018-03-09 ENCOUNTER — Telehealth (INDEPENDENT_AMBULATORY_CARE_PROVIDER_SITE_OTHER): Payer: Self-pay | Admitting: Orthopaedic Surgery

## 2018-03-09 NOTE — Telephone Encounter (Signed)
Patient called stating he is scheduled for MRI on 4/17 and wants to wait to schedule MRI follow-up appointment.  Patient states if everything looks okay he would prefer a phone call if possible.

## 2018-03-09 NOTE — Telephone Encounter (Signed)
Please call pt with results

## 2018-03-09 NOTE — Telephone Encounter (Signed)
Remind me when scan performed

## 2018-03-11 ENCOUNTER — Ambulatory Visit
Admission: RE | Admit: 2018-03-11 | Discharge: 2018-03-11 | Disposition: A | Discharge status: Home / Self Care | Payer: Medicare Other | Source: Ambulatory Visit | Attending: Orthopaedic Surgery | Admitting: Orthopaedic Surgery

## 2018-03-11 DIAGNOSIS — M545 Low back pain: Principal | ICD-10-CM

## 2018-03-11 DIAGNOSIS — G8929 Other chronic pain: Secondary | ICD-10-CM

## 2018-03-11 DIAGNOSIS — M5416 Radiculopathy, lumbar region: Secondary | ICD-10-CM | POA: Diagnosis not present

## 2018-03-12 NOTE — Telephone Encounter (Signed)
Printed off MRI result and placed on your desk. Please call pt for results. Thank you

## 2018-03-12 NOTE — Telephone Encounter (Signed)
Called yesterday

## 2018-03-17 DIAGNOSIS — M79605 Pain in left leg: Secondary | ICD-10-CM | POA: Diagnosis not present

## 2018-03-17 DIAGNOSIS — M79604 Pain in right leg: Secondary | ICD-10-CM | POA: Diagnosis not present

## 2018-03-19 DIAGNOSIS — Z0000 Encounter for general adult medical examination without abnormal findings: Secondary | ICD-10-CM | POA: Diagnosis not present

## 2018-03-19 DIAGNOSIS — F418 Other specified anxiety disorders: Secondary | ICD-10-CM | POA: Diagnosis not present

## 2018-03-19 DIAGNOSIS — Z6833 Body mass index (BMI) 33.0-33.9, adult: Secondary | ICD-10-CM | POA: Diagnosis not present

## 2018-03-19 DIAGNOSIS — G4733 Obstructive sleep apnea (adult) (pediatric): Secondary | ICD-10-CM | POA: Diagnosis not present

## 2018-03-19 DIAGNOSIS — E7849 Other hyperlipidemia: Secondary | ICD-10-CM | POA: Diagnosis not present

## 2018-03-19 DIAGNOSIS — Z1389 Encounter for screening for other disorder: Secondary | ICD-10-CM | POA: Diagnosis not present

## 2018-03-19 DIAGNOSIS — R2681 Unsteadiness on feet: Secondary | ICD-10-CM | POA: Diagnosis not present

## 2018-03-19 DIAGNOSIS — I10 Essential (primary) hypertension: Secondary | ICD-10-CM | POA: Diagnosis not present

## 2018-03-27 DIAGNOSIS — Z1212 Encounter for screening for malignant neoplasm of rectum: Secondary | ICD-10-CM | POA: Diagnosis not present

## 2018-03-28 ENCOUNTER — Encounter: Payer: Self-pay | Admitting: Neurology

## 2018-03-31 ENCOUNTER — Ambulatory Visit (INDEPENDENT_AMBULATORY_CARE_PROVIDER_SITE_OTHER): Payer: Medicare Other | Admitting: Adult Health

## 2018-03-31 ENCOUNTER — Encounter: Payer: Self-pay | Admitting: Adult Health

## 2018-03-31 VITALS — BP 128/73 | HR 60 | Ht 69.0 in | Wt 225.0 lb

## 2018-03-31 DIAGNOSIS — G4733 Obstructive sleep apnea (adult) (pediatric): Secondary | ICD-10-CM | POA: Diagnosis not present

## 2018-03-31 DIAGNOSIS — Z9989 Dependence on other enabling machines and devices: Secondary | ICD-10-CM

## 2018-03-31 DIAGNOSIS — R269 Unspecified abnormalities of gait and mobility: Secondary | ICD-10-CM | POA: Diagnosis not present

## 2018-03-31 NOTE — Progress Notes (Addendum)
PATIENT: Nathan Delgado DOB: 1941-03-25  REASON FOR VISIT: follow up- OSA on CPAP HISTORY FROM: patient  HISTORY OF PRESENT ILLNESS: Today 03/31/18 Nathan Delgado is a 77 year old male with a history of obstructive sleep apnea on CPAP.  He returns today for follow-up.  CPAP download indicates that he use his machine 30 out of 30 days for compliance of 100%.  He uses machine greater than 4 hours each night.  On average he uses his machine 7 hours and 38 minutes.  His residual AHI is 1.4 on 10 cm of water with EPR of 3.  He does not have a significant leak.  The patient feels that the CPAP continues to work well for him.  He reports that he has seen his orthopedist as well as his primary care in regards to his walking.  He states that his son has made mention that he does not pick his feet up when he is ambulating.  The patient reports that he is aware that he does this when someone points it out.  He reports that he is able to correct this as long as he thinks about it.  Reports that his orthopedist did an MRI of the lumbar spine that was relatively unremarkable.  There has been no additional testing in regards to his walking.  He returns today for evaluation.  HISTORY 03/25/2017 (copied from Dr. Guadelupe Sabin notes): I reviewed his CPAP compliance data from 02/22/2017 through 03/23/2017, which is a total of 30 days, during which time he used his CPAP every night with percent used days greater than 4 hours at 100%, indicating superb compliance with an average usage of 8 hours and 2 minutes, residual AHI 0.9 per hour, leak acceptable with the 95th percentile at 8.9 L/m on a pressure of 10 cm with EPR of 3. He reports doing well with CPAP, working well for him, tolerating the interface and pressure. Has had appendicitis and had AE on 10/20/16. He has bouts of anxiety for which he takes tranxene as needed. He works out 3 days a week with a Physiological scientist, Annice Pih. His sleep is less interrupted. Nocturia is  later at night now, rather than earlier in the night. Has Received supplies on a regular basis. Has no new complaints with the exception that he has balance problems and feeling of dizziness, he has had workup for this, has seen ENT, has had his eyes checked, has no significant positional vertigo. He feels as if he has fullness in his head. He has a longer standing history of anxiety which is managed with Wellbutrin once daily and as needed Tranxene.    REVIEW OF SYSTEMS: Out of a complete 14 system review of symptoms, the patient complains only of the following symptoms, and all other reviewed systems are negative.  See HPI  ALLERGIES: No Known Allergies  HOME MEDICATIONS: Outpatient Medications Prior to Visit  Medication Sig Dispense Refill  . acetaminophen (TYLENOL) 325 MG tablet You can take up to 4000 mg of this per day. (Patient taking differently: as needed. You can take up to 4000 mg of this per day.)    . aspirin 81 MG chewable tablet Chew by mouth.    . clorazepate (TRANXENE) 3.75 MG tablet Take 3.75 mg by mouth 2 (two) times daily as needed for anxiety.    . divalproex (DEPAKOTE) 125 MG DR tablet Take 125 mg by mouth at bedtime.  2  . Fish Oil-Cholecalciferol (OMEGA-3 + VITAMIN D3 PO) Take by  mouth.    . losartan-hydrochlorothiazide (HYZAAR) 100-12.5 MG tablet     . Multiple Vitamin (MULTIVITAMIN) tablet Take 1 tablet by mouth daily.    Marland Kitchen omeprazole (PRILOSEC) 20 MG capsule Take by mouth.    . oxyCODONE (OXY IR/ROXICODONE) 5 MG immediate release tablet Use for pain not relieved by Tylenol. 30 tablet 0  . polyethylene glycol (MIRALAX / GLYCOLAX) packet You can use this as needed for constipation.  Follow package instructions.  If you use the fiber supplement, it should relieve most of your chronic constipation issues.  You can buy this over the counter. 14 each 0  . traMADol (ULTRAM) 50 MG tablet 1-2 tabs po bid prn 30 tablet 0  . buPROPion (WELLBUTRIN SR) 150 MG 12 hr tablet Take  150 mg by mouth daily.     Marland Kitchen buPROPion (WELLBUTRIN SR) 150 MG 12 hr tablet     . psyllium (HYDROCIL/METAMUCIL) 95 % PACK Use package instructions, and adjust so you have a soft bowel movement daily.  You can buy over the counter. (Patient not taking: Reported on 02/25/2018) 56 each   . saccharomyces boulardii (FLORASTOR) 250 MG capsule You can buy this over the counter at any drug store, I would take it for a month and then discontinue it. (Patient not taking: Reported on 02/25/2018)     No facility-administered medications prior to visit.     PAST MEDICAL HISTORY: Past Medical History:  Diagnosis Date  . Allergic rhinitis   . Apnea   . Difficult intubation 10/20/2106  . ED (erectile dysfunction)   . Fibromyalgia   . GERD (gastroesophageal reflux disease)   . Hypertension   . Lateral epicondylitis   . Paronychia   . Purulent appendicitis 10/23/2016    PAST SURGICAL HISTORY: Past Surgical History:  Procedure Laterality Date  . CATARACT EXTRACTION, BILATERAL    . INGUINAL HERNIA REPAIR    . KNEE ARTHROSCOPY    . LAPAROSCOPIC APPENDECTOMY N/A 10/20/2016   Procedure: APPENDECTOMY LAPAROSCOPIC;  Surgeon: Greer Pickerel, MD;  Location: [facility];  Service: General;  Laterality: N/A;  . TONSILLECTOMY      FAMILY HISTORY: Family History  Problem Relation Age of Onset  . Cancer Mother   . Dementia Father   . Depression Father   . Macular degeneration Father   . Neuropathy Neg Hx     SOCIAL HISTORY: Social History   Socioeconomic History  . Marital status: Divorced    Spouse name: Not on file  . Number of children: 2  . Years of education: College  . Highest education level: Not on file  Occupational History  . Occupation: Retired   Scientific laboratory technician  . Financial resource strain: Not on file  . Food insecurity:    Worry: Not on file    Inability: Not on file  . Transportation needs:    Medical: Not on file    Non-medical: Not on file  Tobacco Use  . Smoking status: Former  Smoker    Packs/day: 1.00    Years: 15.00    Pack years: 15.00    Types: Cigarettes    Last attempt to quit: 1997    Years since quitting: 22.3  . Smokeless tobacco: Never Used  Substance and Sexual Activity  . Alcohol use: Yes    Alcohol/week: 8.4 oz    Types: 7 Glasses of wine, 7 Shots of liquor per week    Comment: 2 drinks per day  . Drug use: No  . Sexual activity:  Not Currently    Birth control/protection: Abstinence  Lifestyle  . Physical activity:    Days per week: Not on file    Minutes per session: Not on file  . Stress: Not on file  Relationships  . Social connections:    Talks on phone: Not on file    Gets together: Not on file    Attends religious service: Not on file    Active member of club or organization: Not on file    Attends meetings of clubs or organizations: Not on file    Relationship status: Not on file  . Intimate partner violence:    Fear of current or ex partner: Not on file    Emotionally abused: Not on file    Physically abused: Not on file    Forced sexual activity: Not on file  Other Topics Concern  . Not on file  Social History Narrative   Lives at home    Caffeine use: 2 cup coffee per day       PHYSICAL EXAM  Vitals:   03/31/18 1343  BP: 128/73  Pulse: 60  Weight: 225 lb (102.1 kg)  Height: 5\' 9"  (1.753 m)   Body mass index is 33.23 kg/m.  Generalized: Well developed, in no acute distress   Neurological examination  Mentation: Alert oriented to time, place, history taking. Follows all commands speech and language fluent Cranial nerve II-XII: Pupils were equal round reactive to light. Extraocular movements were full, visual field were full on confrontational test. Facial sensation and strength were normal. Uvula tongue midline. Head turning and shoulder shrug  were normal and symmetric. Motor: The motor testing reveals 5 over 5 strength of all 4 extremities. Good symmetric motor tone is noted throughout.  Sensory: Sensory  testing is intact to soft touch on all 4 extremities. No evidence of extinction is noted.  Coordination: Cerebellar testing reveals good finger-nose-finger and heel-to-shin bilaterally.  Gait and station: Gait is normal but cautious. Tandem gait is slightly unsteady.. Romberg is negative. No drift is seen.  Reflexes: Deep tendon reflexes are symmetric and normal bilaterally.   DIAGNOSTIC DATA (LABS, IMAGING, TESTING) - I reviewed patient records, labs, notes, testing and imaging myself where available.  Lab Results  Component Value Date   WBC 6.9 10/23/2016   HGB 13.6 10/23/2016   HCT 40.6 10/23/2016   MCV 90.8 10/23/2016   PLT 171 10/23/2016      Component Value Date/Time   NA 140 10/23/2016 0451   K 3.4 (L) 10/23/2016 0451   CL 109 10/23/2016 0451   CO2 26 10/23/2016 0451   GLUCOSE 119 (H) 10/23/2016 0451   BUN 21 (H) 10/23/2016 0451   CREATININE 0.97 10/23/2016 0451   CALCIUM 8.1 (L) 10/23/2016 0451   PROT 7.0 10/20/2016 1240   ALBUMIN 3.9 10/20/2016 1240   AST 37 10/20/2016 1240   ALT 44 10/20/2016 1240   ALKPHOS 71 10/20/2016 1240   BILITOT 2.0 (H) 10/20/2016 1240   GFRNONAA >60 10/23/2016 0451   GFRAA >60 10/23/2016 0451     ASSESSMENT AND PLAN 77 y.o. year old male  has a past medical history of Allergic rhinitis, Apnea, Difficult intubation (10/20/2106), ED (erectile dysfunction), Fibromyalgia, GERD (gastroesophageal reflux disease), Hypertension, Lateral epicondylitis, Paronychia, and Purulent appendicitis (10/23/2016). here with :  1.  Obstructive sleep apnea on CPAP 2.  Difficulty with ambulation  The patient CPAP download indicates excellent compliance and good treatment of his apnea.  He is encouraged to continue using  the CPAP nightly and greater than 4 hours each night.  He is advised that he should follow-up with his orthopedist in regards to his ongoing issues with ambulation.  If they feel that this needs a neurological work-up they can make a referral to  our office.  Patient voiced understanding.  He will follow-up in 1 year or sooner if needed.    Ward Givens, MSN, NP-C 03/31/2018, 2:28 PM [facility] [address] [place], [place] [address] [phone]  I reviewed the above note and documentation by the Nurse Practitioner and agree with the history, physical exam, assessment and plan as outlined above. I was immediately available for face-to-face consultation. Star Age, MD, PhD [facility])

## 2018-03-31 NOTE — Patient Instructions (Signed)
Your Plan:  Continue using CPAP nightly and >4 hours If your symptoms worsen or you develop new symptoms please let us know.   Thank you for coming to see us at [facility]. I hope we have been able to provide you high quality care today.  You may receive a patient satisfaction survey over the next few weeks. We would appreciate your feedback and comments so that we may continue to improve ourselves and the health of our patients.  

## 2018-04-01 ENCOUNTER — Other Ambulatory Visit (INDEPENDENT_AMBULATORY_CARE_PROVIDER_SITE_OTHER): Payer: Self-pay | Admitting: Orthopaedic Surgery

## 2018-04-03 NOTE — Telephone Encounter (Signed)
Please advise 

## 2018-04-03 NOTE — Telephone Encounter (Signed)
Ok to renew?  

## 2018-04-06 NOTE — Telephone Encounter (Signed)
CALLED RX INTO PHARMACY

## 2018-04-06 NOTE — Telephone Encounter (Signed)
SENT IN RX 

## 2018-04-12 ENCOUNTER — Other Ambulatory Visit (INDEPENDENT_AMBULATORY_CARE_PROVIDER_SITE_OTHER): Payer: Self-pay | Admitting: Orthopaedic Surgery

## 2018-04-13 NOTE — Telephone Encounter (Signed)
Ok to renew?  

## 2018-04-13 NOTE — Telephone Encounter (Signed)
PLEASE ADVISE.

## 2018-04-14 ENCOUNTER — Other Ambulatory Visit (INDEPENDENT_AMBULATORY_CARE_PROVIDER_SITE_OTHER): Payer: Self-pay | Admitting: Radiology

## 2018-04-14 MED ORDER — TRAMADOL HCL 50 MG PO TABS: ORAL_TABLET | ORAL | 0 refills | Status: DC

## 2018-06-09 DIAGNOSIS — L821 Other seborrheic keratosis: Secondary | ICD-10-CM | POA: Diagnosis not present

## 2018-06-09 DIAGNOSIS — D225 Melanocytic nevi of trunk: Secondary | ICD-10-CM | POA: Diagnosis not present

## 2018-06-09 DIAGNOSIS — Z85828 Personal history of other malignant neoplasm of skin: Secondary | ICD-10-CM | POA: Diagnosis not present

## 2018-06-09 DIAGNOSIS — D1801 Hemangioma of skin and subcutaneous tissue: Secondary | ICD-10-CM | POA: Diagnosis not present

## 2018-06-09 DIAGNOSIS — L570 Actinic keratosis: Secondary | ICD-10-CM | POA: Diagnosis not present

## 2018-06-09 DIAGNOSIS — L814 Other melanin hyperpigmentation: Secondary | ICD-10-CM | POA: Diagnosis not present

## 2018-06-25 ENCOUNTER — Encounter: Payer: Self-pay | Admitting: Neurology

## 2018-06-25 ENCOUNTER — Ambulatory Visit (INDEPENDENT_AMBULATORY_CARE_PROVIDER_SITE_OTHER): Payer: Medicare Other | Admitting: Neurology

## 2018-06-25 ENCOUNTER — Encounter

## 2018-06-25 VITALS — BP 126/80 | Ht 69.0 in | Wt 231.8 lb

## 2018-06-25 DIAGNOSIS — R2689 Other abnormalities of gait and mobility: Secondary | ICD-10-CM | POA: Diagnosis not present

## 2018-06-25 DIAGNOSIS — G20 Parkinson's disease: Secondary | ICD-10-CM | POA: Diagnosis not present

## 2018-06-25 DIAGNOSIS — R52 Pain, unspecified: Secondary | ICD-10-CM | POA: Diagnosis not present

## 2018-06-25 DIAGNOSIS — G1229 Other motor neuron disease: Secondary | ICD-10-CM

## 2018-06-25 DIAGNOSIS — R270 Ataxia, unspecified: Secondary | ICD-10-CM | POA: Diagnosis not present

## 2018-06-25 DIAGNOSIS — R252 Cramp and spasm: Secondary | ICD-10-CM

## 2018-06-25 DIAGNOSIS — E531 Pyridoxine deficiency: Secondary | ICD-10-CM | POA: Diagnosis not present

## 2018-06-25 DIAGNOSIS — R799 Abnormal finding of blood chemistry, unspecified: Secondary | ICD-10-CM | POA: Diagnosis not present

## 2018-06-25 DIAGNOSIS — G1221 Amyotrophic lateral sclerosis: Secondary | ICD-10-CM | POA: Diagnosis not present

## 2018-06-25 DIAGNOSIS — G3281 Cerebellar ataxia in diseases classified elsewhere: Secondary | ICD-10-CM

## 2018-06-25 DIAGNOSIS — W19XXXA Unspecified fall, initial encounter: Secondary | ICD-10-CM

## 2018-06-25 NOTE — Progress Notes (Signed)
[facility]    Provider:  Dr Jaynee Eagles Referring Provider: Leanna Battles, MD Primary Care Physician:  Leanna Battles, MD  CC:  Gait abnormality  Interval history 06/29/2018: Not walking right, been going on for a few years.  He fell over a year ago, his left toe caguht a step and hit his knee. But if he walks around the room he needs to hold onto something. He tends to shuffle, doesn't pick up feet, drags his foot, unsteady when walking, burning on the lateral lower extremities. No weakness. He can push heavy weights at the gym over 200 pounds, not weak legs.  No sensory changes in the feet. His right leg has lots of muscle tightness.  No tremors. No speech changes. Stable handwriting. No memory or cognitive or behavioral changes. 1st cousin with PD, father died at 87 with dementia. No problems chewing, swallowing, no wet pillows.   HPI:  Nathan Delgado is a 77 y.o. male here as a referral from Dr. Philip Aspen for burning thighs. PMHx HTN and anxiety.  He has burning in his anterior lateral thighs, 15 years ago he was diagnosed as having fibromyalgia He ws given clorazepate and taking it for years. Balance is ok. It is the sensation in his legs that is really bothering him. Burning happens every day, happens with sitting or standing. The burning feeling is symmetrical and in the anterior thighs, radiates a little past the knees. Symptoms have been worsening over the last 3 months. No known inciting factors but he has gained weight as the symptoms worsened. This is not muscle burning, like he went to the gym - it is more superficial. Wellbutrin not helping, was started due to Fibromyalgia. He has anxiety. Symptoms have gotten more severe, more noticeable, more aggravating. Ibuprofen and clorazepate helps. He does not have significant low back pain or radicular symptoms. No bowel or bladder changes. Denies weakness, denies distal paresthesias or numbness in his toes, denies neck pain or  any other focal neurologic symptoms.    Reviewed notes, labs and imaging from outside physicians, which showed:   Ct of the head in 2009 showed No acute intracranial abnormalities including mass lesion or mass effect, hydrocephalus, extra-axial fluid collection, midline shift, hemorrhage, or acute infarction, large ischemic events (personally reviewed images)     Review of Systems: Patient complains of symptoms per HPI as well as the following symptoms: weakness, aching muscles, allergies, weakness, anxiety. Pertinent negatives per HPI. All others negative.   Social History   Socioeconomic History  . Marital status: Divorced    Spouse name: Not on file  . Number of children: 2  . Years of education: College  . Highest education level: Not on file  Occupational History  . Occupation: Retired   Scientific laboratory technician  . Financial resource strain: Not on file  . Food insecurity:    Worry: Not on file    Inability: Not on file  . Transportation needs:    Medical: Not on file    Non-medical: Not on file  Tobacco Use  . Smoking status: Former Smoker    Packs/day: 1.00    Years: 15.00    Pack years: 15.00    Types: Cigarettes    Last attempt to quit: 1997    Years since quitting: 22.6  . Smokeless tobacco: Never Used  Substance and Sexual Activity  . Alcohol use: Yes    Alcohol/week: 8.4 oz    Types: 7 Glasses of wine, 7 Shots of liquor  per week    Comment: 2 drinks per day  . Drug use: No  . Sexual activity: Not Currently    Birth control/protection: Abstinence  Lifestyle  . Physical activity:    Days per week: Not on file    Minutes per session: Not on file  . Stress: Not on file  Relationships  . Social connections:    Talks on phone: Not on file    Gets together: Not on file    Attends religious service: Not on file    Active member of club or organization: Not on file    Attends meetings of clubs or organizations: Not on file    Relationship status: Not on file  .  Intimate partner violence:    Fear of current or ex partner: Not on file    Emotionally abused: Not on file    Physically abused: Not on file    Forced sexual activity: Not on file  Other Topics Concern  . Not on file  Social History Narrative   Lives at home    Caffeine use: 2 cup coffee per day     Family History  Problem Relation Age of Onset  . Cancer Mother   . Dementia Father   . Depression Father   . Macular degeneration Father   . Neuropathy Neg Hx     Past Medical History:  Diagnosis Date  . Allergic rhinitis   . Anxiety   . Apnea   . Bilateral leg edema   . Cyst of right kidney    small exophytic cyst, incidental finding along with appendicitis on CT scan  . Difficult intubation 10/20/2106  . ED (erectile dysfunction)   . Fibromyalgia   . GERD (gastroesophageal reflux disease)   . Hayfever   . Hypertension   . Lateral epicondylitis   . Paronychia   . Purulent appendicitis 10/23/2016    Past Surgical History:  Procedure Laterality Date  . CATARACT EXTRACTION, BILATERAL    . COLONOSCOPY  01/2014   normal  . INGUINAL HERNIA REPAIR    . KNEE ARTHROSCOPY    . LAPAROSCOPIC APPENDECTOMY N/A 10/20/2016   Procedure: APPENDECTOMY LAPAROSCOPIC;  Surgeon: Greer Pickerel, MD;  Location: [facility];  Service: General;  Laterality: N/A;  . TONSILLECTOMY      Current Outpatient Medications  Medication Sig Dispense Refill  . acetaminophen (TYLENOL) 325 MG tablet You can take up to 4000 mg of this per day. (Patient taking differently: as needed. You can take up to 4000 mg of this per day.)    . aspirin 81 MG chewable tablet Chew by mouth.    Marland Kitchen atorvastatin (LIPITOR) 40 MG tablet Take 40 mg by mouth daily.  6  . clorazepate (TRANXENE) 3.75 MG tablet Take 3.75 mg by mouth 2 (two) times daily as needed for anxiety.    . divalproex (DEPAKOTE) 125 MG DR tablet Take 125 mg by mouth at bedtime.  2  . Fish Oil-Cholecalciferol (OMEGA-3 + VITAMIN D3 PO) Take by mouth.    .  losartan-hydrochlorothiazide (HYZAAR) 100-12.5 MG tablet     . Multiple Vitamin (MULTIVITAMIN) tablet Take 1 tablet by mouth daily.    Marland Kitchen omeprazole (PRILOSEC) 20 MG capsule Take by mouth.    . polyethylene glycol (MIRALAX / GLYCOLAX) packet You can use this as needed for constipation.  Follow package instructions.  If you use the fiber supplement, it should relieve most of your chronic constipation issues.  You can buy this over the counter.  14 each 0   No current facility-administered medications for this visit.     Allergies as of 06/25/2018  . (No Known Allergies)    Vitals: BP 126/80   Ht 5\' 9"  (1.753 m)   Wt 231 lb 12.8 oz (105.1 kg)   BMI 34.23 kg/m  Last Weight:  Wt Readings from Last 1 Encounters:  06/25/18 231 lb 12.8 oz (105.1 kg)   Last Height:   Ht Readings from Last 1 Encounters:  06/25/18 5\' 9"  (1.753 m)    Physical exam: Exam: Gen: NAD, conversant, well nourised, obese, well groomed                     CV: RRR, no MRG. No Carotid Bruits. No peripheral edema, warm, nontender Eyes: Conjunctivae clear without exudates or hemorrhage  Neuro: Detailed Neurologic Exam  Speech:    Speech is normal; fluent and spontaneous with normal comprehension.  Cognition:    The patient is oriented to person, place, and time;     recent and remote memory intact;     language fluent;     normal attention, concentration,     fund of knowledge Cranial Nerves: hypomimia    The pupils are equal, round, and reactive to light. The fundi are flat. Visual fields are full to finger confrontation. Extraocular movements are intact. Trigeminal sensation is intact and the muscles of mastication are normal. The face is symmetric. The palate elevates in the midline. Hearing intact. Voice is normal. Shoulder shrug is normal. The tongue has normal motion without fasciculations.   Coordination:    Normal finger to nose and heel to shin. Normal rapid alternating movements.   Gait:     Imbalance with tandem and heel walk. No weakness on heel walk or toe walk. Slight shuffling, enbloc turn, decreased right arm swing.   Motor Observation:     No asymmetry, no atrophy, and no involuntary movements noted. Tone:    Right leg may have increased tone vs paratonia  Posture:    Posture is normal.     Strength:    Strength is V/V in the upper and lower limbs.      Sensation: intact to LT distally, hyperesthesia in the anterior lateral thighs.     Reflex Exam:  DTR's: lowers brisk, uppers hypo, crossed adductors    Toes:    Left upgoing? Clonus:    Clonus is absent.      Assessment/Plan:  77 year old male with parkinsonism  Mri brain and cervical spine and thoracic spine DAT Scan to differentiate between Parkinson's disorders or other etiology of symptoms Labs today Discussed fall risks  Orders Placed This Encounter  Procedures  . MR BRAIN W WO CONTRAST  . MR CERVICAL SPINE WO CONTRAST  . MR THORACIC SPINE WO CONTRAST  . NM BRAIN IMAGING W/SPECT (DATSCAN)  . Vitamin B6  . TSH  . RPR  . Heavy metals, blood  . Copper, serum  . Comprehensive metabolic panel  . CBC  . B12 and Folate Panel  . Methylmalonic acid, serum   Consider emg/ncs   Sarina Ill, MD  [facility] [address] [place], [place] [address]  Phone [phone] Fax [phone]  A total of 40 minutes was spent face-to-face with this patient. Over half this time was spent on counseling patient on the  1. Parkinsonism, unspecified Parkinsonism type (Angie)   2. 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 Ataxia   6. Fall, initial encounter   7. Spasticity     diagnosis and different diagnostic and therapeutic options, counseling and coordination of care, risks ans benefits of management, compliance, or risk factor reduction and education.

## 2018-06-25 NOTE — Patient Instructions (Addendum)
MRI brain, cervical and thoracic spine Labs today After MRIs may consider DAT Scan

## 2018-06-27 LAB — CBC
HEMOGLOBIN: 15.5 g/dL (ref 13.0–17.7)
Hematocrit: 48.4 % (ref 37.5–51.0)
MCH: 29 pg (ref 26.6–33.0)
MCHC: 32 g/dL (ref 31.5–35.7)
MCV: 91 fL (ref 79–97)
PLATELETS: 215 10*3/uL (ref 150–450)
RBC: 5.35 x10E6/uL (ref 4.14–5.80)
RDW: 14.4 % (ref 12.3–15.4)
WBC: 5.7 10*3/uL (ref 3.4–10.8)

## 2018-06-27 LAB — COMPREHENSIVE METABOLIC PANEL
A/G RATIO: 2 (ref 1.2–2.2)
ALT: 41 IU/L (ref 0–44)
AST: 31 IU/L (ref 0–40)
Albumin: 4.5 g/dL (ref 3.5–4.8)
Alkaline Phosphatase: 50 IU/L (ref 39–117)
BILIRUBIN TOTAL: 0.5 mg/dL (ref 0.0–1.2)
BUN/Creatinine Ratio: 16 (ref 10–24)
BUN: 16 mg/dL (ref 8–27)
CHLORIDE: 101 mmol/L (ref 96–106)
CO2: 28 mmol/L (ref 20–29)
Calcium: 9.8 mg/dL (ref 8.6–10.2)
Creatinine, Ser: 0.99 mg/dL (ref 0.76–1.27)
GFR calc Af Amer: 85 mL/min/{1.73_m2} (ref >59)
GFR calc non Af Amer: 74 mL/min/{1.73_m2} (ref >59)
Globulin, Total: 2.3 g/dL (ref 1.5–4.5)
Glucose: 70 mg/dL (ref 65–99)
POTASSIUM: 4.3 mmol/L (ref 3.5–5.2)
Sodium: 143 mmol/L (ref 134–144)
Total Protein: 6.8 g/dL (ref 6.0–8.5)

## 2018-06-27 LAB — HEAVY METALS, BLOOD
Arsenic: 7 ug/L (ref 2–23)
LEAD, BLOOD: NOT DETECTED ug/dL (ref 0–4)
Mercury: 3.7 ug/L (ref 0.0–14.9)

## 2018-06-27 LAB — B12 AND FOLATE PANEL
Folate: >20 ng/mL (ref >3.0)
VITAMIN B 12: 1082 pg/mL (ref 232–1245)

## 2018-06-27 LAB — VITAMIN B6: Vitamin B6: 59.1 ug/L — ABNORMAL HIGH (ref 5.3–46.7)

## 2018-06-27 LAB — METHYLMALONIC ACID, SERUM: Methylmalonic Acid: 189 nmol/L (ref 0–378)

## 2018-06-27 LAB — TSH: TSH: 1.76 u[IU]/mL (ref 0.450–4.500)

## 2018-06-27 LAB — COPPER, SERUM: COPPER: 90 ug/dL (ref 72–166)

## 2018-06-27 LAB — RPR: RPR: NONREACTIVE

## 2018-06-29 ENCOUNTER — Telehealth: Payer: Self-pay | Admitting: Neurology

## 2018-06-29 DIAGNOSIS — G20 Parkinson's disease: Secondary | ICD-10-CM | POA: Insufficient documentation

## 2018-06-29 DIAGNOSIS — G20C Parkinsonism, unspecified: Secondary | ICD-10-CM | POA: Insufficient documentation

## 2018-06-29 NOTE — Telephone Encounter (Signed)
Medicare/bcbs supp order sent to [facility]. No auth they will reach out to the pt to schedule.  °

## 2018-07-09 ENCOUNTER — Ambulatory Visit
Admission: RE | Admit: 2018-07-09 | Discharge: 2018-07-09 | Disposition: A | Discharge status: Home / Self Care | Payer: Medicare Other | Source: Ambulatory Visit | Attending: Neurology | Admitting: Neurology

## 2018-07-09 DIAGNOSIS — G3281 Cerebellar ataxia in diseases classified elsewhere: Secondary | ICD-10-CM

## 2018-07-09 DIAGNOSIS — R2689 Other abnormalities of gait and mobility: Secondary | ICD-10-CM

## 2018-07-09 DIAGNOSIS — W19XXXA Unspecified fall, initial encounter: Secondary | ICD-10-CM

## 2018-07-09 DIAGNOSIS — G1221 Amyotrophic lateral sclerosis: Secondary | ICD-10-CM

## 2018-07-09 DIAGNOSIS — R270 Ataxia, unspecified: Secondary | ICD-10-CM

## 2018-07-09 DIAGNOSIS — G1229 Other motor neuron disease: Secondary | ICD-10-CM

## 2018-07-09 DIAGNOSIS — G20 Parkinson's disease: Secondary | ICD-10-CM

## 2018-07-09 DIAGNOSIS — G20C Parkinsonism, unspecified: Secondary | ICD-10-CM

## 2018-07-09 DIAGNOSIS — R252 Cramp and spasm: Secondary | ICD-10-CM

## 2018-07-09 MED ORDER — GADOBENATE DIMEGLUMINE 529 MG/ML IV SOLN
20.0000 mL | Freq: Once | INTRAVENOUS | Status: AC | PRN
  Administered 2018-07-09: 20 mL via INTRAVENOUS

## 2018-07-13 ENCOUNTER — Telehealth: Payer: Self-pay | Admitting: *Deleted

## 2018-07-13 NOTE — Telephone Encounter (Signed)
-----   Message from Melvenia Beam, MD sent at 07/13/2018  9:49 AM EDT ----- MRI of the brain shows significant volume loss/brain atrophy more than expected for his age. This can be due to many reasons including dementia, parkinson's disease or other neurologic disorders. Loss of brain volume can be a cause of gait abnormalities. No strokes or other findings to explain symptoms. I suggest we wait for the DAT scan before making any conclusions.

## 2018-07-13 NOTE — Telephone Encounter (Signed)
Called and spoke with pt about MRI brain results per AA,MD note. He requested copy be sent to Dr. Philip Aspen (PCP). I forwarded via Epic. He would like to know status of insurance approval for DAT scan. Advised I will send to Raquel Sarna to check on status of this for him. He verbalized understanding and appreciation.

## 2018-07-14 NOTE — Telephone Encounter (Signed)
Patient is approved for DAT scan. [facility] willl be calling him to schedule. I have talked to patient.

## 2018-07-15 NOTE — Telephone Encounter (Signed)
Patient calling to discuss further MRI results and DAT test that is to be scheduled.

## 2018-07-15 NOTE — Telephone Encounter (Signed)
I called pt back. Went over MRI results again with pt. Advised DAT scan will help determine if he has parkinson's disease. This will help her dx and treat him accordingly. Advised per Hinton Dyer C note that DAT scan approved via insurance. Collie Siad Long should be calling to get him scheduled. Asked him to call back if he does not hear about scheduling. He verbalized understanding and appreciation for call.

## 2018-07-17 DIAGNOSIS — R600 Localized edema: Secondary | ICD-10-CM | POA: Diagnosis not present

## 2018-07-17 DIAGNOSIS — R2681 Unsteadiness on feet: Secondary | ICD-10-CM | POA: Diagnosis not present

## 2018-07-17 DIAGNOSIS — M545 Low back pain: Secondary | ICD-10-CM | POA: Diagnosis not present

## 2018-07-17 DIAGNOSIS — G608 Other hereditary and idiopathic neuropathies: Secondary | ICD-10-CM | POA: Diagnosis not present

## 2018-07-23 ENCOUNTER — Encounter (INDEPENDENT_AMBULATORY_CARE_PROVIDER_SITE_OTHER): Payer: Self-pay | Admitting: Orthopaedic Surgery

## 2018-07-23 ENCOUNTER — Ambulatory Visit (INDEPENDENT_AMBULATORY_CARE_PROVIDER_SITE_OTHER): Payer: Medicare Other | Admitting: Orthopaedic Surgery

## 2018-07-23 VITALS — BP 119/68 | HR 57 | Ht 70.0 in | Wt 230.0 lb

## 2018-07-23 DIAGNOSIS — G8929 Other chronic pain: Secondary | ICD-10-CM

## 2018-07-23 DIAGNOSIS — M25511 Pain in right shoulder: Secondary | ICD-10-CM | POA: Diagnosis not present

## 2018-07-23 MED ORDER — LIDOCAINE HCL 2 % IJ SOLN
2.0000 mL | INTRAMUSCULAR | Status: AC | PRN
  Administered 2018-07-23: 2 mL

## 2018-07-23 MED ORDER — METHYLPREDNISOLONE ACETATE 40 MG/ML IJ SUSP
80.0000 mg | INTRAMUSCULAR | Status: AC | PRN
  Administered 2018-07-23: 80 mg

## 2018-07-23 MED ORDER — BUPIVACAINE HCL 0.5 % IJ SOLN
2.0000 mL | INTRAMUSCULAR | Status: AC | PRN
  Administered 2018-07-23: 2 mL via INTRA_ARTICULAR

## 2018-07-23 NOTE — Progress Notes (Signed)
Office Visit Note   Patient: Nathan Delgado           Date of Birth: 31-Aug-1941           MRN: [id number] Visit Date: 07/23/2018              Requested by: Leanna Battles, MD [place], [place] [address] PCP: Leanna Battles, MD   Assessment & Plan: Visit Diagnoses:  1. Chronic right shoulder pain     Plan: Impingement symptoms right shoulder.  Will reinject with cortisone and monitor response  Follow-Up Instructions: Return if symptoms worsen or fail to improve.   Orders:  Orders Placed This Encounter  Procedures  . Large Joint Inj: R subacromial bursa   No orders of the defined types were placed in this encounter.     Procedures: Large Joint Inj: R subacromial bursa on 07/23/2018 4:09 PM Indications: pain and diagnostic evaluation Details: 25 G 1.5 in needle, anterolateral approach  Arthrogram: No  Medications: 2 mL lidocaine 2 %; 2 mL bupivacaine 0.5 %; 80 mg methylPREDNISolone acetate 40 MG/ML Consent was given by the patient. Immediately prior to procedure a time out was called to verify the correct patient, procedure, equipment, support staff and site/side marked as required. Patient was prepped and draped in the usual sterile fashion.       Clinical Data: No additional findings.   Subjective: Chief Complaint  Patient presents with  . Follow-up    R BICEP PAIN RADIATES DOWN TO FINGERS HAS NUMBNESS IN HANDS WHEN HE WAKES GOING ON FOR OVER 1 MO  Mr. Otting has been seen on a number of occasions regarding her problems having with his right shoulder.  Last year we obtain an MRI scan revealing some rotator cuff tendinitis and subacromial bursitis but without an obvious tear.  He is responded nicely to a subacromial cortisone injection.  He has had some recent recurrence of his shoulder pain without action. He is in the midst of a neurologic evaluation to rule out Parkinson's.  He has had some tingling into his left extremity  aches  HPI  Review of Systems  Constitutional: Negative for fatigue and fever.  HENT: Negative for ear pain.   Eyes: Negative for pain.  Respiratory: Negative for cough and shortness of breath.   Cardiovascular: Negative for leg swelling.  Gastrointestinal: Negative for constipation and diarrhea.  Genitourinary: Negative for difficulty urinating.  Musculoskeletal: Negative for back pain and neck pain.  Skin: Negative for rash.  Allergic/Immunologic: Negative for food allergies.  Neurological: Positive for weakness and numbness.  Hematological: Does not bruise/bleed easily.  Psychiatric/Behavioral: Negative for sleep disturbance.     Objective: Vital Signs: BP 119/68 (BP Location: Left Arm, Patient Position: Sitting, Cuff Size: Normal)   Pulse (!) 57   Ht 5\' 10"  (1.778 m)   Wt 230 lb (104.3 kg)   BMI 33.00 kg/m   Physical Exam  Constitutional: He is oriented to person, place, and time. He appears well-developed and well-nourished.  HENT:  Mouth/Throat: Oropharynx is clear and moist.  Eyes: Pupils are equal, round, and reactive to light. EOM are normal.  Pulmonary/Chest: Effort normal.  Neurological: He is alert and oriented to person, place, and time.  Skin: Skin is warm and dry.  Psychiatric: He has a normal mood and affect. His behavior is normal.    Ortho Exam awake alert and oriented x3.  Comfortable sitting.  No tremor.  Seem to walk normally.  Does have positive impingement  right shoulder.  Good grip and release.  Able to place his arm over his head but with a circuitous motion.  Biceps intact.  Skin intact Mild discomfort to palpation in the anterior subacromial region.  No crepitation Specialty Comments:  No specialty comments available.  Imaging: No results found.   PMFS History: Patient Active Problem List   Diagnosis Date Noted  . Parkinsonism ([place]) 06/29/2018  . 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 S/P laparoscopic appendectomy 10/20/2016  . 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 Fibromyalgia 04/23/2015   Past Medical History:  Diagnosis Date  . Allergic rhinitis   . Anxiety   . Apnea   . Bilateral leg edema   . Cyst of right kidney    small exophytic cyst, incidental finding along with appendicitis on CT scan  . Difficult intubation 10/20/2106  . ED (erectile dysfunction)   . Fibromyalgia   . GERD (gastroesophageal reflux disease)   . Hayfever   . Hypertension   . Lateral epicondylitis   . Paronychia   . 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 Cancer Mother   . Dementia Father   . Depression Father   . Macular degeneration Father   . Neuropathy Neg Hx     Past Surgical History:  Procedure Laterality Date  . CATARACT EXTRACTION, BILATERAL    . COLONOSCOPY  01/2014   normal  . INGUINAL HERNIA REPAIR    . KNEE ARTHROSCOPY    . LAPAROSCOPIC APPENDECTOMY N/A 10/20/2016   Procedure: APPENDECTOMY LAPAROSCOPIC;  Surgeon: Greer Pickerel, MD;  Location: [facility];  Service: General;  Laterality: N/A;  . TONSILLECTOMY     Social History   Occupational History  . Occupation: Retired   Tobacco Use  . Smoking status: Former Smoker    Packs/day: 1.00    Years: 15.00    Pack years: 15.00    Types: Cigarettes    Last attempt to quit: 1997    Years since quitting: 22.6  . Smokeless tobacco: Never Used  Substance and Sexual Activity  . Alcohol use: Yes    Alcohol/week: 14.0 standard drinks    Types: 7 Glasses of wine, 7 Shots of liquor per week    Comment: 2 drinks per day  . Drug use: No  . Sexual activity: Not Currently    Birth control/protection: Abstinence

## 2018-07-29 ENCOUNTER — Encounter: Payer: Self-pay | Admitting: Adult Health

## 2018-08-06 ENCOUNTER — Encounter (HOSPITAL_COMMUNITY)
Admission: RE | Admit: 2018-08-06 | Discharge: 2018-08-06 | Disposition: A | Discharge status: Home / Self Care | Payer: Medicare Other | Source: Ambulatory Visit | Attending: Neurology | Admitting: Neurology

## 2018-08-06 DIAGNOSIS — G20 Parkinson's disease: Secondary | ICD-10-CM | POA: Insufficient documentation

## 2018-08-06 DIAGNOSIS — G9389 Other specified disorders of brain: Secondary | ICD-10-CM | POA: Diagnosis not present

## 2018-08-06 DIAGNOSIS — R270 Ataxia, unspecified: Secondary | ICD-10-CM

## 2018-08-06 DIAGNOSIS — R2689 Other abnormalities of gait and mobility: Secondary | ICD-10-CM | POA: Diagnosis not present

## 2018-08-06 DIAGNOSIS — W19XXXA Unspecified fall, initial encounter: Secondary | ICD-10-CM

## 2018-08-06 DIAGNOSIS — G20C Parkinsonism, unspecified: Secondary | ICD-10-CM

## 2018-08-06 MED ORDER — IOFLUPANE I 123 185 MBQ/2.5ML IV SOLN
4.3000 | Freq: Once | INTRAVENOUS | Status: AC
  Administered 2018-08-06: 4.3 via INTRAVENOUS

## 2018-08-06 MED ORDER — IODINE STRONG (LUGOLS) 5 % PO SOLN
0.8000 mL | Freq: Once | ORAL | Status: AC
  Administered 2018-08-06: 0.8 mL via ORAL

## 2018-08-19 ENCOUNTER — Encounter: Payer: Self-pay | Admitting: Neurology

## 2018-08-19 ENCOUNTER — Ambulatory Visit (INDEPENDENT_AMBULATORY_CARE_PROVIDER_SITE_OTHER): Payer: Medicare Other | Admitting: Neurology

## 2018-08-19 DIAGNOSIS — G20 Parkinson's disease: Secondary | ICD-10-CM

## 2018-08-19 MED ORDER — ROTIGOTINE 2 MG/24HR TD PT24: MEDICATED_PATCH | TRANSDERMAL | 3 refills | Status: DC

## 2018-08-19 NOTE — Patient Instructions (Addendum)
Start with 2mg  for one week than increase to 4mg .    Parkinson Disease Parkinson disease is a long-term (chronic) condition. It gets worse over time (is progressive). Parkinson disease limits your ability:  To control how your body moves.  To move your body normally.  This condition affects each person differently. The condition can range from mild to very bad. This condition tends to progress slowly over many years. Follow these instructions at home:  Take over-the-counter and prescription medicines only as told by your doctor.  Put grab bars and railings in your home. These help to prevent falls.  Follow instructions from your doctor about what you can or cannot eat or drink.  Go back to your normal activities as told by your doctor. Ask your doctor what activities are safe for you.  Exercise as told by your doctor or physical therapist.  Keep all follow-up visits as told by your doctor. This is important. These include any visits with a speech or occupational therapist.  Think about joining a support group for people who have Parkinson disease. Contact a doctor if:  Medicines do not help your symptoms.  You feel off-balance.  You fall at home.  You need more help at home.  You have: ? Trouble swallowing. ? A very hard time pooping (constipation). ? A lot of side effects from your medicines.  You see or hear things that are not real (hallucinate).  You feel: ? Confused. ? Anxious. ? Sad (depressed). Get help right away if:  You were hurt in a fall.  You cannot swallow without choking.  You have chest pain.  You have trouble breathing.  You do not feel safe at home. This information is not intended to replace advice given to you by your health care provider. Make sure you discuss any questions you have with your health care provider. Document Released: 02/03/2012 Document Revised: 04/18/2016 Document Reviewed: 09/01/2015 Elsevier Interactive Patient  Education  2018 [employer].     Rotigotine transdermal skin patch What is this medicine? ROTIGOTINE (roe TIG oh teen) is used to control the signs and symptoms of Parkinson's disease or restless legs syndrome. This medicine may be used for other purposes; ask your health care provider or pharmacist if you have questions. COMMON BRAND NAME(S): Neupro What should I tell my health care provider before I take this medicine? They need to know if you have any of these conditions: -heart disease -high blood pressure -lung or breathing disease, like asthma -mental illness -skin cancer -sleep disorder -an unusual or allergic reaction to rotigotine, sulfites, other medicines, foods, dyes, or preservatives -pregnant or trying to get pregnant -breast-feeding How should I use this medicine? This medicine is for external use only. Follow the directions on the prescription label. Use exactly as directed. Wash hands after removing and applying this medicine. Change the patch each day at the same time. Apply the patch to an area of the upper arm or body that is clean, dry, and hairless. Do not use this patch on skin that is injured, irritated, oily, or calloused. Do not apply where the patch will be rubbed by tight clothing or a waistband. Do not apply to the same place more than once every 14 days in order to prevent skin irritation. Do not cut or trim the patch. Take your medicine at regular intervals. Do not take it more often than directed. Do not stop taking except on your doctor's advice. Always remove the old patch before you apply  a new one. Remove patch slowly and carefully to avoid irritation. After removal, fold the patch so that it sticks to itself and throw it away. After removal of patch, wash the area with soap and water to remove any drug or adhesive. Baby oil or mineral oil may be used if needed. Do not use alcohol or other liquids. Talk to your pediatrician regarding the use of this  medicine in children. Special care may be needed. Overdosage: If you think you have taken too much of this medicine contact a poison control center or emergency room at once. NOTE: This medicine is only for you. Do not share this medicine with others. What if I miss a dose? If you miss a dose, take it as soon as you can. If it is almost time for your next dose, take only that dose. Do not take double or extra doses. What may interact with this medicine? -alcohol -antihistamines for allergy, cough and cold -certain medicines for sleep -medicines for depression, anxiety, or psychotic disturbances -metoclopramide -narcotic medicines for pain This list may not describe all possible interactions. Give your health care provider a list of all the medicines, herbs, non-prescription drugs, or dietary supplements you use. Also tell them if you smoke, drink alcohol, or use illegal drugs. Some items may interact with your medicine. What should I watch for while using this medicine? Visit your doctor for regular check ups. Tell your doctor or healthcare professional if your symptoms do not start to get better or if they get worse. You may get drowsy or dizzy. Do not drive, use machinery, or do anything that needs mental alertness until you know how this medicine affects you. Do not stand or sit up quickly, especially if you are an older patient. This reduces the risk of dizzy or fainting spells. Alcohol may interfere with the effect of this medicine. Avoid alcoholic drinks. If you find that you have sudden feelings of wanting to sleep during normal activities, like cooking, watching television, or while driving or riding in a car, you should contact your health care professional. There have been reports of increased sexual urges or other strong urges such as gambling while taking this medicine. If you experience any of these while taking this medicine, you should report this to your health care provider as soon  as possible. This medicine patch is sensitive to certain body heat changes. If your skin gets too hot, more medicine will come out of the patch. Call your healthcare provider if you get a fever. Do not take hot baths. Do not sunbathe. Do not use hot tubs, saunas, hair dryers, heating pads, electric blankets, heated waterbeds, or tanning lamps. Do not do exercise that increases your body temperature. If you are going to have a magnetic resonance imaging (MRI) procedure, tell your MRI technician if you have this patch on your body. It must be removed before a MRI. What side effects may I notice from receiving this medicine? Side effects that you should report to your doctor or health care professional as soon as possible: -allergic reactions like skin rash, itching or hives, swelling of the face, lips, or tongue -anxiety, restlessness -breathing problems -confusion -dizziness -falling asleep during normal activities like driving -fast, irregular or slow heartbeat -feeling faint or lightheaded, falls -hallucination, loss of contact with reality -skin irritation, redness, or swelling -uncontrollable head, mouth, neck, arm, or leg movements -uncontrollable and excessive urges (examples: gambling, binge eating, shopping, having sex) Side effects that usually do not  require medical attention (report to your doctor or health care professional if they continue or are bothersome): -constipation -difficulty sleeping -headache -loss of appetite -nausea, vomiting -stomach pain -weight gain This list may not describe all possible side effects. Call your doctor for medical advice about side effects. You may report side effects to FDA at 1-800-FDA-1088. Where should I keep my medicine? Keep out of the reach of children. Store at room temperature between 15 and 30 degrees C (59 and 86 degrees F). Keep container tightly closed. Store in original pouch until just before use. Throw away any unused medicine  after the expiration date. NOTE: This sheet is a summary. It may not cover all possible information. If you have questions about this medicine, talk to your doctor, pharmacist, or health care provider.  2018 Elsevier/Gold Standard (2016-06-21 15:25:23)

## 2018-08-19 NOTE — Progress Notes (Addendum)
[facility]    Provider:  Dr Jaynee Eagles Referring Provider: Leanna Battles, MD Primary Care Physician:  Leanna Battles, MD  CC:  Gait abnormality  Interval history 08/19/2018: Patient returns after DAT scan consistent with Parkinsonian disorder.  Discussed parkinsonian disorders with patient, he may have early Parkinson's Disease. Discussed etiology, progression, treatment. Starting treatment early can slow down progression as can exercise and activity. Recommended a complete skin evaluation as skin malignancies seen in PD. Will start Neupro patch, had samples, if he feels it helps will order oral medications.  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   Interval history 06/29/2018: Not walking right, been going on for a few years.  He fell over a year ago, his left toe caguht a step and hit his knee. But if he walks around the room he needs to hold onto something. He tends to shuffle, doesn't pick up feet, drags his foot, unsteady when walking, burning on the lateral lower extremities. No weakness. He can push heavy weights at the gym over 200 pounds, not weak legs.  No sensory changes in the feet. His right leg has lots of muscle tightness.  No tremors. No speech changes. Stable handwriting. No memory or cognitive or behavioral changes. 1st cousin with PD, father died at 82 with dementia. No problems chewing, swallowing, no wet pillows.   HPI:  Nathan Delgado is a 77 y.o. male here as a referral from Dr. Philip Aspen for burning thighs. PMHx HTN and anxiety.  He has burning in his anterior lateral thighs, 15 years ago he was diagnosed as having fibromyalgia He ws given clorazepate and taking it for years. Balance is ok. It is the sensation in his legs that is really bothering him. Burning happens every day, happens  with sitting or standing. The burning feeling is symmetrical and in the anterior thighs, radiates a little past the knees. Symptoms have been worsening over the last 3 months. No known inciting factors but he has gained weight as the symptoms worsened. This is not muscle burning, like he went to the gym - it is more superficial. Wellbutrin not helping, was started due to Fibromyalgia. He has anxiety. Symptoms have gotten more severe, more noticeable, more aggravating. Ibuprofen and clorazepate helps. He does not have significant low back pain or radicular symptoms. No bowel or bladder changes. Denies weakness, denies distal paresthesias or numbness in his toes, denies neck pain or any other focal neurologic symptoms.    Reviewed notes, labs and imaging from outside physicians, which showed:   Ct of the head in 2009 showed No acute intracranial abnormalities including mass lesion or mass effect, hydrocephalus, extra-axial fluid collection, midline shift, hemorrhage, or acute infarction, large ischemic events (personally reviewed images)     Review of Systems: Patient complains of symptoms per HPI as well as the following symptoms: weakness, aching muscles, allergies, weakness, anxiety. Pertinent negatives per HPI. All others negative.   Social History   Socioeconomic History  . Marital status: Divorced    Spouse name: Not on file  . Number of children: 2  . Years of education: College  . Highest education level: Not on file  Occupational History  . Occupation: Retired   Scientific laboratory technician  . Financial resource strain: Not on file  . Food insecurity:    Worry: Not on file    Inability: Not on  file  . Transportation needs:    Medical: Not on file    Non-medical: Not on file  Tobacco Use  . Smoking status: Former Smoker    Packs/day: 1.00    Years: 15.00    Pack years: 15.00    Types: Cigarettes    Last attempt to quit: 1997    Years since quitting: 22.7  . Smokeless tobacco: Never Used   Substance and Sexual Activity  . Alcohol use: Yes    Alcohol/week: 14.0 standard drinks    Types: 7 Glasses of wine, 7 Shots of liquor per week    Comment: 1-2 drinks per day either 2 wines or scotch and water before dinner and then wine with dinner   . Drug use: No  . Sexual activity: Not Currently    Birth control/protection: Abstinence  Lifestyle  . Physical activity:    Days per week: Not on file    Minutes per session: Not on file  . Stress: Not on file  Relationships  . Social connections:    Talks on phone: Not on file    Gets together: Not on file    Attends religious service: Not on file    Active member of club or organization: Not on file    Attends meetings of clubs or organizations: Not on file    Relationship status: Not on file  . Intimate partner violence:    Fear of current or ex partner: Not on file    Emotionally abused: Not on file    Physically abused: Not on file    Forced sexual activity: Not on file  Other Topics Concern  . Not on file  Social History Narrative   Lives at home alone   Caffeine use: 1 cup coffee per day    Left handed    Family History  Problem Relation Age of Onset  . Cancer Mother   . Dementia Father   . Depression Father   . Macular degeneration Father   . Neuropathy Neg Hx     Past Medical History:  Diagnosis Date  . Allergic rhinitis   . Anxiety   . Apnea   . Bilateral leg edema   . Cyst of right kidney    small exophytic cyst, incidental finding along with appendicitis on CT scan  . Difficult intubation 10/20/2106  . ED (erectile dysfunction)   . Fibromyalgia   . GERD (gastroesophageal reflux disease)   . Hayfever   . Hypertension   . Lateral epicondylitis   . Paronychia   . Purulent appendicitis 10/23/2016    Past Surgical History:  Procedure Laterality Date  . CATARACT EXTRACTION, BILATERAL    . COLONOSCOPY  01/2014   normal  . INGUINAL HERNIA REPAIR    . KNEE ARTHROSCOPY    . LAPAROSCOPIC  APPENDECTOMY N/A 10/20/2016   Procedure: APPENDECTOMY LAPAROSCOPIC;  Surgeon: Greer Pickerel, MD;  Location: [facility];  Service: General;  Laterality: N/A;  . TONSILLECTOMY      Current Outpatient Medications  Medication Sig Dispense Refill  . acetaminophen (TYLENOL) 325 MG tablet You can take up to 4000 mg of this per day. (Patient taking differently: as needed. You can take up to 4000 mg of this per day.)    . aspirin 81 MG chewable tablet Chew by mouth.    Marland Kitchen atorvastatin (LIPITOR) 40 MG tablet Take 40 mg by mouth daily.  6  . clorazepate (TRANXENE) 3.75 MG tablet Take 3.75 mg by mouth 2 (two) times  daily as needed for anxiety.    . Fish Oil-Cholecalciferol (OMEGA-3 + VITAMIN D3 PO) Take by mouth.    . losartan-hydrochlorothiazide (HYZAAR) 100-12.5 MG tablet     . MAGNESIUM PO Take by mouth.    . Multiple Vitamin (MULTIVITAMIN) tablet Take 1 tablet by mouth daily.    Marland Kitchen omeprazole (PRILOSEC) 20 MG capsule Take by mouth.    . hydrOXYzine (VISTARIL) 25 MG capsule TAKE ONE CAPSULE BY MOUTH DAILY AS NEEDED FOR ANXIETY  5  . rotigotine (NEUPRO) 2 MG/24HR Use one patch a day start with 2mg  and increase to 4mg . 30 patch 3   No current facility-administered medications for this visit.     Allergies as of 08/19/2018  . (No Known Allergies)    Vitals: BP 137/81 (BP Location: Right Arm, Patient Position: Sitting)   Pulse 84   Ht 5\' 10"  (1.778 m)   Wt 228 lb (103.4 kg)   BMI 32.71 kg/m  Last Weight:  Wt Readings from Last 1 Encounters:  08/19/18 228 lb (103.4 kg)   Last Height:   Ht Readings from Last 1 Encounters:  08/19/18 5\' 10"  (1.778 m)    Physical exam: Exam: Gen: NAD, conversant, well nourised, obese, well groomed                     CV: RRR, no MRG. No Carotid Bruits. No peripheral edema, warm, nontender Eyes: Conjunctivae clear without exudates or hemorrhage  Neuro: Detailed Neurologic Exam  Speech:    Speech is normal; fluent and spontaneous with normal comprehension.    Cognition:    The patient is oriented to person, place, and time;     recent and remote memory intact;     language fluent;     normal attention, concentration,     fund of knowledge Cranial Nerves: hypomimia    The pupils are equal, round, and reactive to light. The fundi are flat. Visual fields are full to finger confrontation. Extraocular movements are intact. Trigeminal sensation is intact and the muscles of mastication are normal. The face is symmetric. The palate elevates in the midline. Hearing intact. Voice is normal. Shoulder shrug is normal. The tongue has normal motion without fasciculations.   Coordination:    Normal finger to nose and heel to Delgado. Normal rapid alternating movements.   Gait:    Imbalance with tandem and heel walk. No weakness on heel walk or toe walk. Slight shuffling, enbloc turn, decreased right arm swing.   Motor Observation:     No asymmetry, no atrophy, and no involuntary movements noted. Tone:    Right leg may have increased tone vs paratonia  Posture:    Posture is normal.     Strength:    Strength is V/V in the upper and lower limbs.      Sensation: intact to LT distally, hyperesthesia in the anterior lateral thighs.     Reflex Exam:  DTR's: lowers brisk, uppers hypo, crossed adductors    Toes:    Left upgoing? Clonus:    Clonus is absent.      Assessment/Plan:  77 year old absolutely lovely gentleman with parkinsonism  Mri brain and cervical spine and thoracic spine did not show any etiology of his gait abnormality, mri brain with atrophy more than expected for age  - DAT scan consistent with Parkinsonian disorder.    - Starting treatment early can slow down progression as can exercise and activity.   - Recommended a complete skin evaluation  as skin malignancies seen in PD.   -Will start Neupro patch(a patch form of Dopamine Agonist), had samples, if he feels it helps we can try to get Neurpro approved or will try the oral  form of these dopamine agonist medications(Requip, Mirapex). Sinemet (Carbidopa/Levodopa) is also a possibility to try.  - Discussed fall risks  - provided literature on PD organizations, exercise classes and support groups in the area  CC: DR. Gerome Apley, MD  [facility] [address] [place], [place] [address]  Phone [phone] Fax [phone]  A total of 45 minutes was spent face-to-face with this patient. Over half this time was spent on counseling patient on the  1. PD (Parkinson's disease) ([place])     diagnosis and different diagnostic and therapeutic options, counseling and coordination of care, risks ans benefits of management, compliance, or risk factor reduction and education.

## 2018-08-24 DIAGNOSIS — L218 Other seborrheic dermatitis: Secondary | ICD-10-CM | POA: Diagnosis not present

## 2018-08-24 DIAGNOSIS — L814 Other melanin hyperpigmentation: Secondary | ICD-10-CM | POA: Diagnosis not present

## 2018-08-24 DIAGNOSIS — D1801 Hemangioma of skin and subcutaneous tissue: Secondary | ICD-10-CM | POA: Diagnosis not present

## 2018-08-24 DIAGNOSIS — Z85828 Personal history of other malignant neoplasm of skin: Secondary | ICD-10-CM | POA: Diagnosis not present

## 2018-08-24 DIAGNOSIS — D225 Melanocytic nevi of trunk: Secondary | ICD-10-CM | POA: Diagnosis not present

## 2018-08-24 DIAGNOSIS — L821 Other seborrheic keratosis: Secondary | ICD-10-CM | POA: Diagnosis not present

## 2018-09-01 ENCOUNTER — Telehealth: Payer: Self-pay | Admitting: Neurology

## 2018-09-01 ENCOUNTER — Other Ambulatory Visit: Payer: Self-pay | Admitting: Neurology

## 2018-09-01 DIAGNOSIS — G20 Parkinson's disease: Secondary | ICD-10-CM

## 2018-09-01 MED ORDER — ROTIGOTINE 4 MG/24HR TD PT24: 1.0000 | MEDICATED_PATCH | Freq: Every day | TRANSDERMAL | 3 refills | Status: DC

## 2018-09-01 NOTE — Telephone Encounter (Signed)
I have talked to Mr. Benney and he will be here to 09/01/2018  To sign paper for Neupro . Romelle Starcher will you please print hard copy RX.

## 2018-09-01 NOTE — Telephone Encounter (Signed)
Dr. Jaynee Eagles printed the paper prescription for Neupro.

## 2018-09-01 NOTE — Telephone Encounter (Signed)
Nathan Delgado, would you be able to help this patient with Neupro financial assistance? He is doing well on 4mg  patch but his copay is $300. Is there a copay assistance you can help him with? If so please call and work with patient. thanks

## 2018-09-02 DIAGNOSIS — Z23 Encounter for immunization: Secondary | ICD-10-CM | POA: Diagnosis not present

## 2018-09-02 NOTE — Telephone Encounter (Signed)
From the [employer] website: Apply once a day to the skin; press firmly in place for 30 seconds, making good contact. Do not place Neupro on oily, irritated, or damaged skin, or where it will be rubbed by tight clothing. Do not use the same site more than once every 14 days.

## 2018-09-02 NOTE — Telephone Encounter (Signed)
I called the patient and informed him of the instructions. Also told him that I spoke with Dr. Jaynee Eagles and she said he can use his arms, thighs, abdomen, low back/hip area and he can rotate through all of these sites each day. Patient verbalized understanding & appreciation for the call and the samples from Dr. Jaynee Eagles.

## 2018-09-02 NOTE — Telephone Encounter (Signed)
Patient came and signed forms for Patient assistance Neupro Patch. 7-10 day turn around time . [employer] [phone]- fax [phone]   Dr. Jaynee Eagles or Romelle Starcher . Patient is wanting to know if he should be moving his Neupro Patch around when he changes it to different places ? Please call him. Thanks Hinton Dyer .

## 2018-09-22 DIAGNOSIS — I10 Essential (primary) hypertension: Secondary | ICD-10-CM | POA: Diagnosis not present

## 2018-09-22 DIAGNOSIS — G20 Parkinson's disease: Secondary | ICD-10-CM | POA: Diagnosis not present

## 2018-09-22 DIAGNOSIS — E7849 Other hyperlipidemia: Secondary | ICD-10-CM | POA: Diagnosis not present

## 2018-09-22 DIAGNOSIS — G4733 Obstructive sleep apnea (adult) (pediatric): Secondary | ICD-10-CM | POA: Diagnosis not present

## 2018-09-22 DIAGNOSIS — R2681 Unsteadiness on feet: Secondary | ICD-10-CM | POA: Diagnosis not present

## 2018-09-22 DIAGNOSIS — Z6834 Body mass index (BMI) 34.0-34.9, adult: Secondary | ICD-10-CM | POA: Diagnosis not present

## 2018-10-05 ENCOUNTER — Telehealth: Payer: Self-pay | Admitting: Neurology

## 2018-10-05 DIAGNOSIS — G20 Parkinson's disease: Secondary | ICD-10-CM

## 2018-10-05 MED ORDER — ROTIGOTINE 4 MG/24HR TD PT24: 1.0000 | MEDICATED_PATCH | Freq: Every day | TRANSDERMAL | 3 refills | Status: DC

## 2018-10-05 NOTE — Telephone Encounter (Signed)
Spoke with pt. Will doc in phone note.

## 2018-10-05 NOTE — Telephone Encounter (Signed)
Spoke with pt today. Used Neupro website as a source and discussed the skin sites he can rotate each day. Advised pt to try to set-up a 14 site rotation schedule that he uses in effort to avoid the same skin site in the same 14 day period. Discussed that the reason to avoid the same skin site is d/t risk of skin irritation. Pt reported he has no irritated skin. He stated that he has been trying to avoid the same skin sites. He said he has no problems with getting the patches to stick to his skin. He is careful when he handles the patches. He will also use the pharmacist as a resource prn.  Discussed pt should wash his hands after handling the patches. Pt stated he had watched the video that showed how to apply the patches.He is aware that the prescription for the Neupro 4 mg patches has been sent to his pharmacy. He said he can afford his copay. Dr. Jaynee Eagles also said he could get some samples if needed. His questions were answered. He verbalized appreciation.

## 2018-10-05 NOTE — Addendum Note (Signed)
Addended by: Gildardo Griffes on: 10/05/2018 01:14 PM   Modules accepted: Orders

## 2018-10-05 NOTE — Telephone Encounter (Signed)
Order placed for the Neupro patch to [facility] on [address].

## 2018-10-05 NOTE — Telephone Encounter (Signed)
Order for Neupro patch refilled to [facility] on [address].

## 2018-10-05 NOTE — Telephone Encounter (Signed)
Called [employer] Patient assistance program Patient was denied because patient's income was to high .   [employer] Telephone [phone].   Called and told patient.

## 2018-10-05 NOTE — Telephone Encounter (Signed)
Patient  Needs a RX for Neupro patch called in. Nathan Delgado patient want's you to call him because he is forgetting where he puts his patch on and he is suppose to put in different areas on his body please call ?

## 2018-10-15 ENCOUNTER — Telehealth: Payer: Self-pay | Admitting: Neurology

## 2018-10-15 NOTE — Telephone Encounter (Signed)
Pt requesting a call stating [facility] is needing medical reasoning behind Vitamin B6 and TSH before covering that part of the test. Please advise

## 2018-10-15 NOTE — Telephone Encounter (Signed)
Returned call to Nathan Delgado to clarify codes.  Patient is aware to disregard current bill.  They will resubmit the services to his insurance plan.

## 2018-10-29 DIAGNOSIS — E785 Hyperlipidemia, unspecified: Secondary | ICD-10-CM | POA: Diagnosis not present

## 2018-10-29 DIAGNOSIS — Z6834 Body mass index (BMI) 34.0-34.9, adult: Secondary | ICD-10-CM | POA: Diagnosis not present

## 2018-10-29 DIAGNOSIS — G20 Parkinson's disease: Secondary | ICD-10-CM | POA: Diagnosis not present

## 2018-10-29 DIAGNOSIS — N6459 Other signs and symptoms in breast: Secondary | ICD-10-CM | POA: Diagnosis not present

## 2018-12-07 ENCOUNTER — Telehealth: Payer: Self-pay | Admitting: Neurology

## 2018-12-07 ENCOUNTER — Telehealth: Payer: Self-pay

## 2018-12-07 DIAGNOSIS — H01002 Unspecified blepharitis right lower eyelid: Secondary | ICD-10-CM | POA: Diagnosis not present

## 2018-12-07 DIAGNOSIS — H16223 Keratoconjunctivitis sicca, not specified as Sjogren's, bilateral: Secondary | ICD-10-CM | POA: Diagnosis not present

## 2018-12-07 DIAGNOSIS — H15002 Unspecified scleritis, left eye: Secondary | ICD-10-CM | POA: Diagnosis not present

## 2018-12-07 DIAGNOSIS — H01001 Unspecified blepharitis right upper eyelid: Secondary | ICD-10-CM | POA: Diagnosis not present

## 2018-12-07 NOTE — Telephone Encounter (Signed)
That is fine. I will go ahead and put him on the nurse schedule for pickup Wed 1/15 @ 3:30 pm.

## 2018-12-07 NOTE — Telephone Encounter (Signed)
Patient called stating that if possible he could come Wednesday 15 at 3:30 to pick up his medication. He wanting a call back to confirm. Please advise.

## 2018-12-07 NOTE — Telephone Encounter (Signed)
Pt called stating that he is no longer sleep well. He stated that he knows the doctor can pull information off his cpap machine and he would like to know why he is not sleeping well. I informed the pt that the cpap download will tell if he is wearing his machine or if he has a mask leak. I told him that from my understanding the cpap download would not give any information on why he was not sleeping well if it didn't have anything to do with those two things. Pt stated he is wearing his machine at least 6 hours a night. Pt stated he still would like a call back from the nurse to tell him why he is not sleeping well.

## 2018-12-07 NOTE — Telephone Encounter (Signed)
Pt returned my call. He will talk with [employer] about a mask refit and try melatonin.  Pt is wondering if Romelle Starcher can call him and let him know if he can pick up neupro patches while is "fighting" for insurance coverage.

## 2018-12-07 NOTE — Telephone Encounter (Signed)
I reviewed his CPAP compliance download from 06/30/2014 through 07/29/2018 which is the most recent data available for review. He was 100 percent compliant, AHI at goal at 1.6/hour on average, pressure at 10 cm, average usage of 7 hours and 26 minutes which is very good. Leak on the high side with the 95th percentile at 26.8 L/m. He may benefit from a mask refit with his DME company. Please call patient to advise him of this. Furthermore, he could try melatonin at night for sleep, 1 mg to 3 mg, one to 2 hours before bedtime, may go up to 5 mg if needed.

## 2018-12-07 NOTE — Telephone Encounter (Signed)
I called pt to discuss. No answer, left a message asking him to call me back. 

## 2018-12-07 NOTE — Telephone Encounter (Signed)
I called pt. He reports that he has not been sleeping well for the past 2-3 months. He wakes up several times a night and does not feel well rested. He believes it has something to do with his cpap and wants to know if the cpap data shows how many times he is waking up per night. I checked [employer] but his cpap has not updated since 07/2018. Pt reports that he uses it every night. I recommended that pt call [employer] for assistance on getting his cpap machine to wirelessly update with his data. Pt wants Dr. Rexene Alberts to review his data from September and let him know how it looks and if she has any recommendations on helping him sleep better. He reports that Dr. Jaynee Eagles told him that the neupro patch may be causing him to have trouble sleeping but he does not think this is the case.

## 2018-12-07 NOTE — Telephone Encounter (Signed)
I called the patient and LVM (ok per DPR) advising him that we do have samples (per Dr. Jaynee Eagles) that he can pickup. However I have asked for a call back to let us know what time he will be coming so we can put him on the nurse scheduled for sample pickup. I left the office hours and office number in the message.

## 2018-12-08 ENCOUNTER — Encounter: Payer: Self-pay | Admitting: *Deleted

## 2018-12-08 NOTE — Telephone Encounter (Signed)
Emailed pt through [employer].

## 2018-12-09 ENCOUNTER — Ambulatory Visit (INDEPENDENT_AMBULATORY_CARE_PROVIDER_SITE_OTHER): Payer: Self-pay | Admitting: *Deleted

## 2018-12-09 DIAGNOSIS — Z760 Encounter for issue of repeat prescription: Secondary | ICD-10-CM

## 2018-12-09 DIAGNOSIS — Z0289 Encounter for other administrative examinations: Secondary | ICD-10-CM

## 2018-12-09 MED ORDER — ROTIGOTINE 4 MG/24HR TD PT24: 1.0000 | MEDICATED_PATCH | Freq: Every day | TRANSDERMAL | 0 refills | Status: DC

## 2018-12-09 MED ORDER — ROTIGOTINE 2 MG/24HR TD PT24: 2.0000 | MEDICATED_PATCH | Freq: Every day | TRANSDERMAL | 0 refills | Status: DC

## 2018-12-09 NOTE — Progress Notes (Signed)
Patient came to pickup neupro patch samples. Reviewed instructions for both strengths of the patches.His dose is 4 mg daily. He was given a copy of his med list including the sample orders. His questions were answered. He was advised to call us he needs or has any problems. He verbalized appreciation.

## 2018-12-18 DIAGNOSIS — Z6834 Body mass index (BMI) 34.0-34.9, adult: Secondary | ICD-10-CM | POA: Diagnosis not present

## 2018-12-18 DIAGNOSIS — R05 Cough: Secondary | ICD-10-CM | POA: Diagnosis not present

## 2018-12-18 DIAGNOSIS — R0982 Postnasal drip: Secondary | ICD-10-CM | POA: Diagnosis not present

## 2019-01-05 IMAGING — MR MR SHOULDER*R* W/O CM
4 of 5 series · 15 of 40 positions shown · non-contrast
Comparison: None.

CLINICAL DATA: Chronic right shoulder pain.

EXAM:
MRI OF THE RIGHT SHOULDER WITHOUT CONTRAST
TECHNIQUE: Multiplanar, multisequence MR imaging of the shoulder was performed.
No intravenous contrast was administered.

[Series 11: T2 fat-sat · axial · right · 3.0mm · 0.44mm/px · z∈[-73,+14]mm · 3 of 31 slices shown (1 of 3)]
[im 4/31]
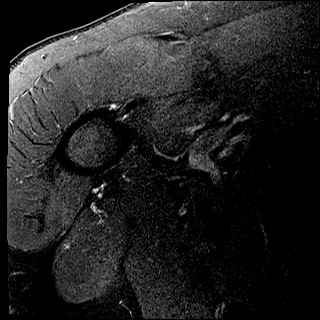
[im 17/31]
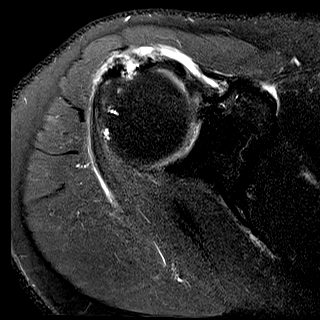
[im 27/31]
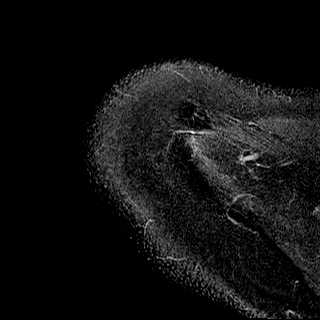

[Series 12: T2 fat-sat · coronal · right · 3.0mm · 0.44mm/px · 3 of 26 slices shown (2 of 3)]
[im 5/26]
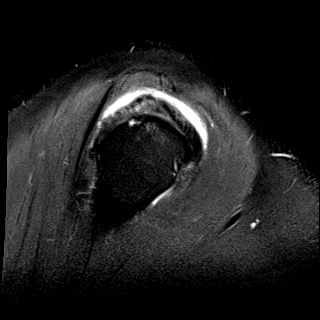
[im 13/26]
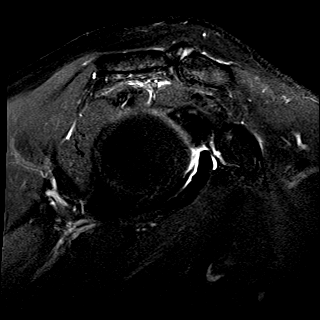
[im 21/26]
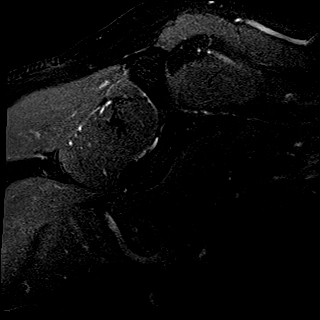

[Series 14: PD · oblique · right · 3.0mm · 0.18mm/px · 6 of 30 slices shown]
[im 1/30]
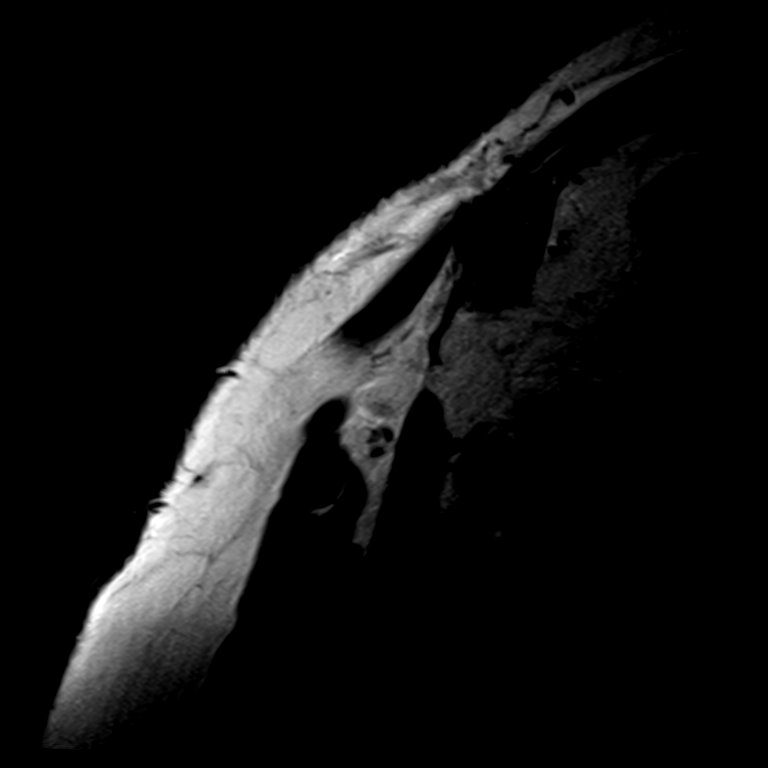
[im 5/30]
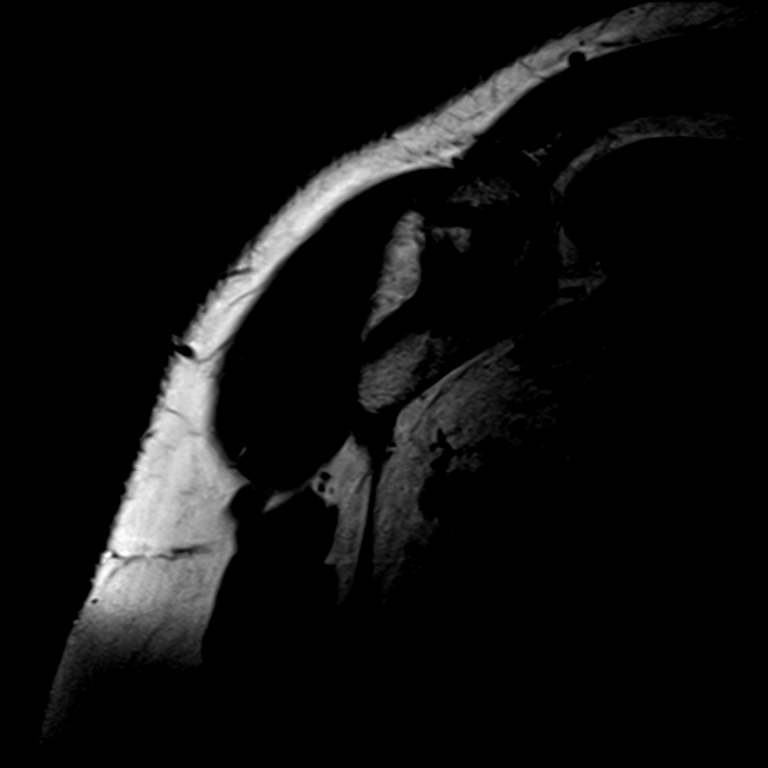
[im 9/30]
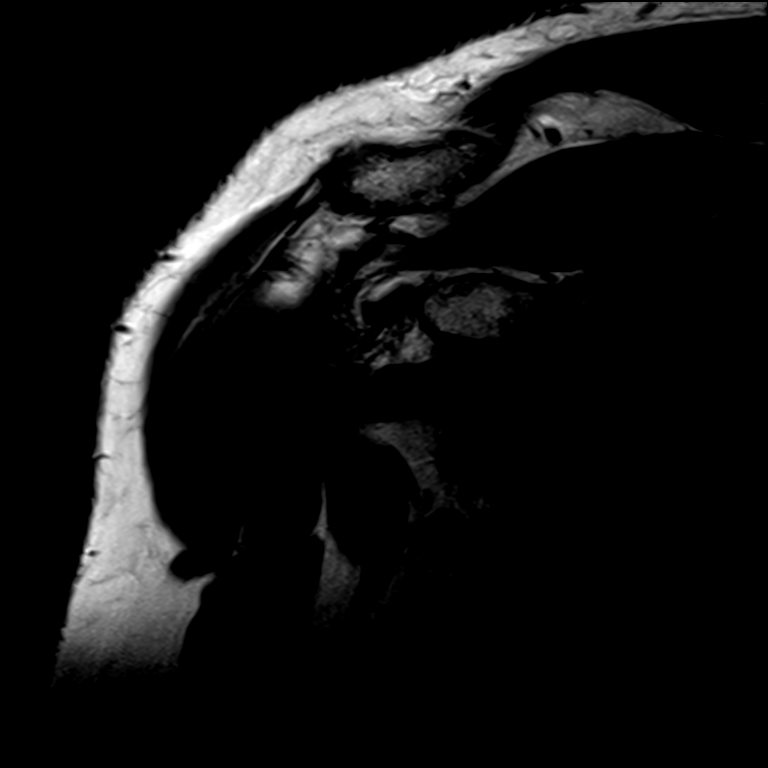
[im 13/30]
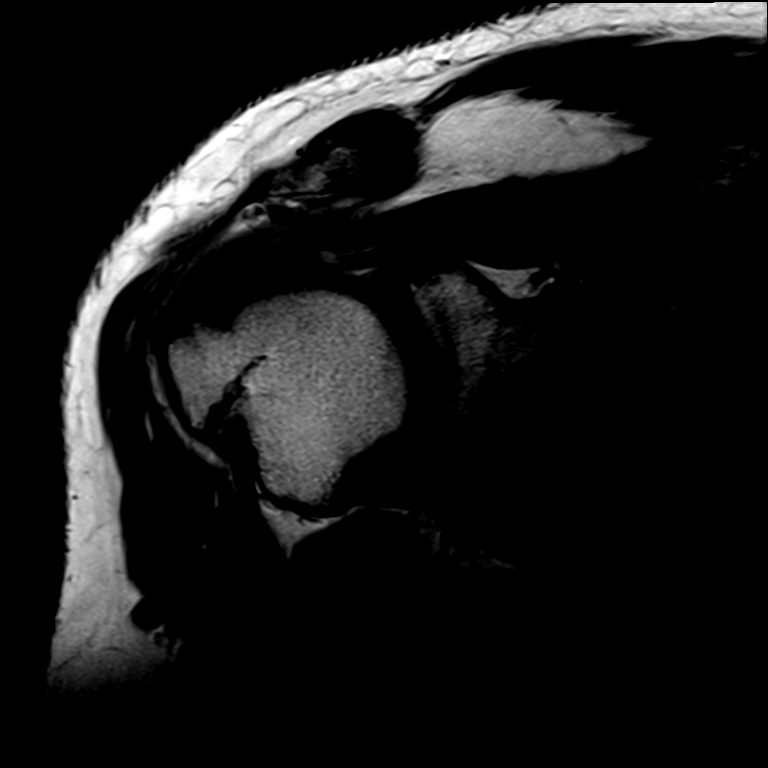
[im 17/30]
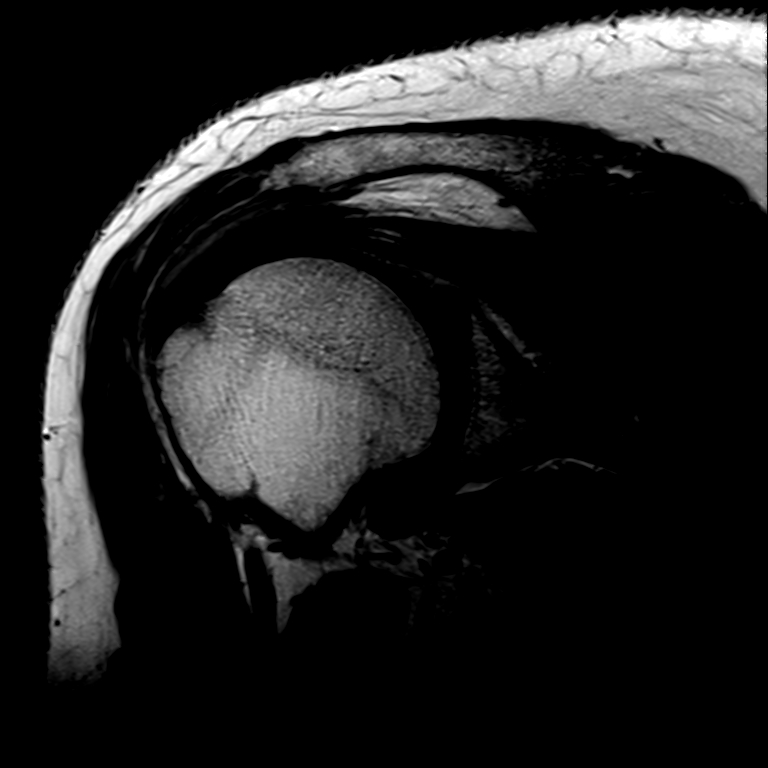
[im 25/30]
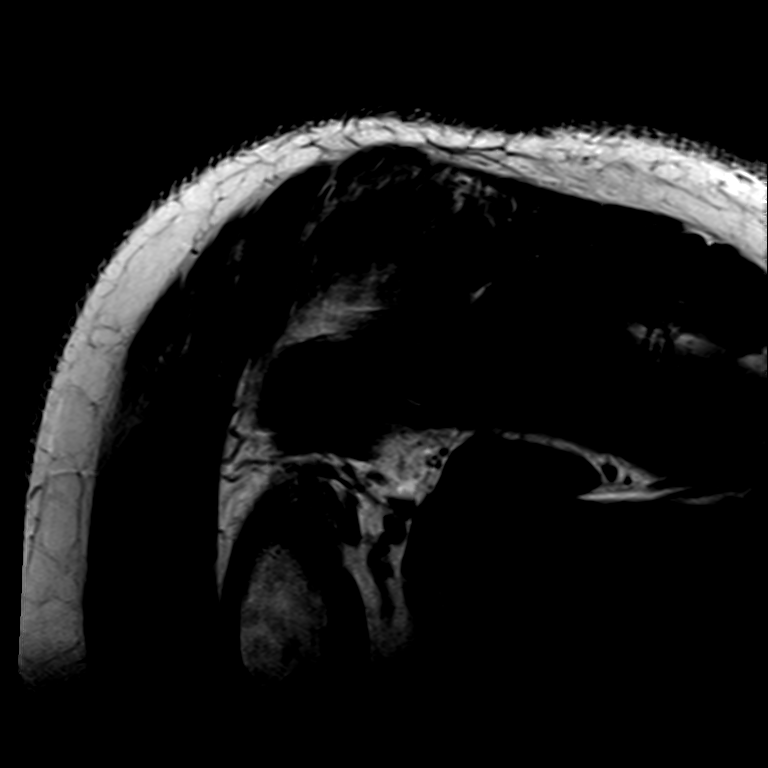

[Series 15: T2 fat-sat · oblique · right · 3.0mm · 0.22mm/px · 3 of 30 slices shown (3 of 3)]
[im 5/30]
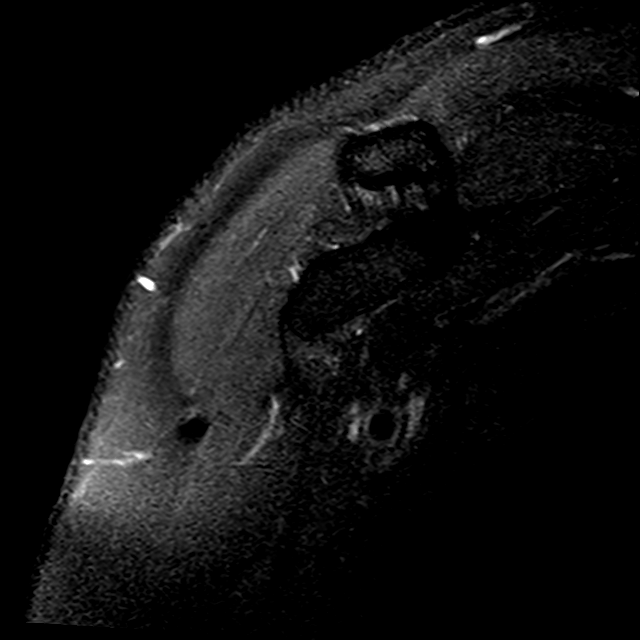
[im 17/30]
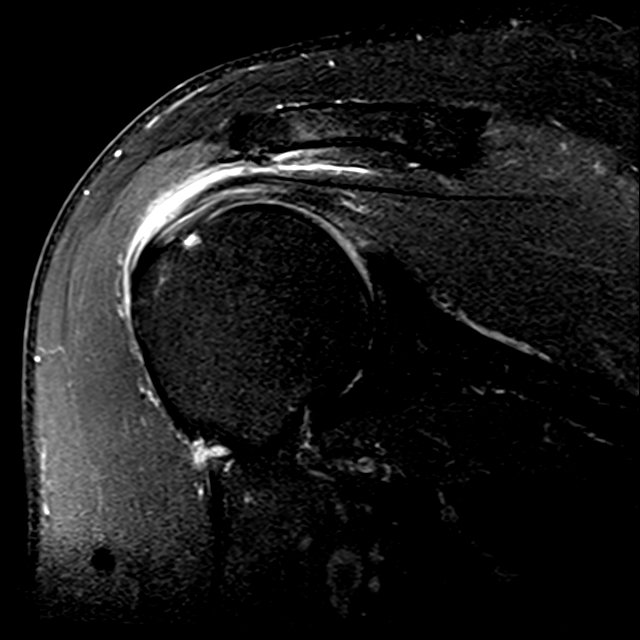
[im 25/30]
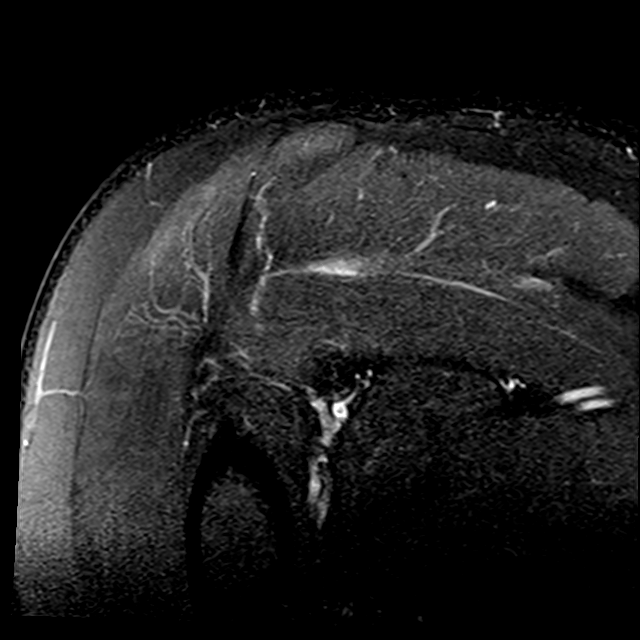

[15 of 40 positions shown; findings below may reference images not displayed]

FINDINGS: Rotator cuff: There is focal degeneration of the anterior aspect of
the distal supraspinous tendon without a discrete tear extending to
the articular or bursal surface.

Muscles: Slight degenerative changes of the musculotendinous
junction of the infraspinatus. No atrophy.

Biceps long head:  Properly located and intact.

Acromioclavicular Joint: Slight AC joint arthropathy. Type 1
acromion. Subacromial/subdeltoid bursitis.

Glenohumeral Joint: Normal.

Labrum: 4 mm paralabral cyst in the 12 o'clock position without a
definable labral tear.

Bones:  No marrow abnormality, fracture or dislocation.

Other: None.
IMPRESSION: 1. Subacromial/subdeltoid bursitis.
2. Focal degeneration of the infraspinatus and supraspinous tendons
without discrete rotator cuff tears.

## 2019-02-18 ENCOUNTER — Telehealth: Payer: Self-pay | Admitting: Neurology

## 2019-02-18 NOTE — Telephone Encounter (Signed)
Spoke with Dr. Jaynee Eagles. 30 minute appt is appropriate. Please call pt to r/s with Amy NP in middle to end of May and advise that d/t uncertainty with [id number] pandemic, may need to re-evaluate appt again when it gets closer.

## 2019-02-18 NOTE — Telephone Encounter (Signed)
Pt has called he thought 3/31 was to be r/s. He got a VM today reminding him of this appt. I see msg thru mychart it was to be r/s, it is a 60 minute appt. I am unable to r/s due to the it being a 60 minute appt. Please call to r/s.

## 2019-02-18 NOTE — Telephone Encounter (Signed)
Called patient and rescheduled him to May with Amy. He also has an appointment in May with Hedwig Morton. for Dr. Rexene Alberts.

## 2019-02-23 ENCOUNTER — Ambulatory Visit: Payer: Medicare Other | Admitting: Neurology

## 2019-03-16 DIAGNOSIS — Z125 Encounter for screening for malignant neoplasm of prostate: Secondary | ICD-10-CM | POA: Diagnosis not present

## 2019-03-16 DIAGNOSIS — E7849 Other hyperlipidemia: Secondary | ICD-10-CM | POA: Diagnosis not present

## 2019-03-16 DIAGNOSIS — I10 Essential (primary) hypertension: Secondary | ICD-10-CM | POA: Diagnosis not present

## 2019-03-17 DIAGNOSIS — I10 Essential (primary) hypertension: Secondary | ICD-10-CM | POA: Diagnosis not present

## 2019-03-17 DIAGNOSIS — R82998 Other abnormal findings in urine: Secondary | ICD-10-CM | POA: Diagnosis not present

## 2019-03-23 DIAGNOSIS — G20 Parkinson's disease: Secondary | ICD-10-CM | POA: Diagnosis not present

## 2019-03-23 DIAGNOSIS — R0982 Postnasal drip: Secondary | ICD-10-CM | POA: Diagnosis not present

## 2019-03-23 DIAGNOSIS — Z1331 Encounter for screening for depression: Secondary | ICD-10-CM | POA: Diagnosis not present

## 2019-03-23 DIAGNOSIS — I10 Essential (primary) hypertension: Secondary | ICD-10-CM | POA: Diagnosis not present

## 2019-03-23 DIAGNOSIS — G4733 Obstructive sleep apnea (adult) (pediatric): Secondary | ICD-10-CM | POA: Diagnosis not present

## 2019-03-23 DIAGNOSIS — Z0000 Encounter for general adult medical examination without abnormal findings: Secondary | ICD-10-CM | POA: Diagnosis not present

## 2019-03-23 DIAGNOSIS — G609 Hereditary and idiopathic neuropathy, unspecified: Secondary | ICD-10-CM | POA: Diagnosis not present

## 2019-03-23 DIAGNOSIS — E785 Hyperlipidemia, unspecified: Secondary | ICD-10-CM | POA: Diagnosis not present

## 2019-03-23 DIAGNOSIS — M545 Low back pain: Secondary | ICD-10-CM | POA: Diagnosis not present

## 2019-04-06 ENCOUNTER — Ambulatory Visit: Payer: Medicare Other | Admitting: Adult Health

## 2019-04-12 ENCOUNTER — Telehealth: Payer: Self-pay

## 2019-04-12 NOTE — Telephone Encounter (Signed)
Unable to get in contact with the patient to offer him a sooner appt for the same day and to convert his office visit into a doxy.me visit with Amy on 04/15/2019. I left a voicemail asking him to return my call. Office number was provided.   If patient calls back please offer him a dozy.me visit with Amy for Thursday morning (04/15/2019).

## 2019-04-12 NOTE — Telephone Encounter (Signed)
Pt provided consent for video visit through doxy me // and to bill insurance  [phone] [employer] [email or web address]

## 2019-04-12 NOTE — Telephone Encounter (Signed)
E-mail and text have been sent with instructions on how to access his doxy.me visit

## 2019-04-15 ENCOUNTER — Encounter: Payer: Self-pay | Admitting: Family Medicine

## 2019-04-15 ENCOUNTER — Other Ambulatory Visit: Payer: Self-pay

## 2019-04-15 ENCOUNTER — Ambulatory Visit: Payer: Medicare Other | Admitting: Family Medicine

## 2019-04-15 ENCOUNTER — Ambulatory Visit (INDEPENDENT_AMBULATORY_CARE_PROVIDER_SITE_OTHER): Payer: Medicare Other | Admitting: Family Medicine

## 2019-04-15 DIAGNOSIS — G4733 Obstructive sleep apnea (adult) (pediatric): Secondary | ICD-10-CM | POA: Diagnosis not present

## 2019-04-15 DIAGNOSIS — Z9989 Dependence on other enabling machines and devices: Secondary | ICD-10-CM | POA: Diagnosis not present

## 2019-04-15 DIAGNOSIS — G20 Parkinson's disease: Secondary | ICD-10-CM | POA: Diagnosis not present

## 2019-04-15 DIAGNOSIS — M791 Myalgia, unspecified site: Secondary | ICD-10-CM | POA: Diagnosis not present

## 2019-04-15 NOTE — Progress Notes (Signed)
PATIENT: Nathan Delgado DOB: September 07, 1941  REASON FOR VISIT: follow up HISTORY FROM: patient  Virtual Visit via Telephone Note  I connected with Nathan Delgado on 04/15/19 at 10:00 AM EDT by telephone and verified that I am speaking with the correct person using two identifiers.   I discussed the limitations, risks, security and privacy concerns of performing an evaluation and management service by telephone and the availability of in person appointments. I also discussed with the patient that there may be a patient responsible charge related to this service. The patient expressed understanding and agreed to proceed.   History of Present Illness:  04/15/19 Nathan Delgado is a 78 y.o. male for follow up of PD. He continues Neupro patch. He is uncertain if it helps. He states that the only symptom of PD is shuffling steps. He is uncertain if he wants to continue Neupro but wishes to think about this. He does have mild redness at times where patch was placed. He denies any respiratory symptoms. He is working with his PCP due muscle aches and cramps in his legs. He has stopped atorvastatin for 1 week and has not noticed much of a difference. He is working with a Insurance underwriter. He is having some difficulty staying asleep. He goes to bed around 11:30 and wakes about 3 times a night. He has dry mouth when he wakes. He is managed by Nathan Delgado. He has appt scheduled for follow up with Nathan Delgado this month.He is monitoring skin with no changes noted. He denies falls.    Interval history 08/19/2018: Patient returns after DAT scan consistent with Parkinsonian disorder.  Discussed parkinsonian disorders with patient, he may have early Parkinson's Disease. Discussed etiology, progression, treatment. Starting treatment early can slow down progression as can exercise and activity. Recommended a complete skin evaluation as skin malignancies seen in PD. Will start Neupro patch, had samples, if he feels  it helps will order oral medications.  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   Interval history 06/29/2018: Not walking right, been going on for a few years.  He fell over a year ago, his left toe caguht a step and hit his knee. But if he walks around the room he needs to hold onto something. He tends to shuffle, doesn't pick up feet, drags his foot, unsteady when walking, burning on the lateral lower extremities. No weakness. He can push heavy weights at the gym over 200 pounds, not weak legs.  No sensory changes in the feet. His right leg has lots of muscle tightness.  No tremors. No speech changes. Stable handwriting. No memory or cognitive or behavioral changes. 1st cousin with PD, father died at 85 with dementia. No problems chewing, swallowing, no wet pillows.   HPI:  Nathan Delgado is a 78 y.o. male here as a referral from Nathan. Nathan Delgado for burning thighs. PMHx HTN and anxiety.  He has burning in his anterior lateral thighs, 15 years ago he was diagnosed as having fibromyalgia He ws given clorazepate and taking it for years. Balance is ok. It is the sensation in his legs that is really bothering him. Burning happens every day, happens with sitting or standing. The burning feeling is symmetrical and in the anterior thighs, radiates a little past the knees. Symptoms have been worsening over the last 3 months. No known  inciting factors but he has gained weight as the symptoms worsened. This is not muscle burning, like he went to the gym - it is more superficial. Wellbutrin not helping, was started due to Fibromyalgia. He has anxiety. Symptoms have gotten more severe, more noticeable, more aggravating. Ibuprofen and clorazepate helps. He does not have significant low back pain or radicular symptoms. No bowel or bladder changes. Denies  weakness, denies distal paresthesias or numbness in his toes, denies neck pain or any other focal neurologic symptoms.    Reviewed notes, labs and imaging from outside physicians, which showed:   Ct of the head in 2009 showed No acute intracranial abnormalities including mass lesion or mass effect, hydrocephalus, extra-axial fluid collection, midline shift, hemorrhage, or acute infarction, large ischemic events (personally reviewed images)   Observations/Objective:  Generalized: Well developed, in no acute distress  Mentation: Alert oriented to time, place, history taking. Follows all commands speech and language fluent   Assessment and Plan:  78 y.o. year old male  has a past medical history of Allergic rhinitis, Anxiety, Apnea, Bilateral leg edema, Cyst of right kidney, Difficult intubation (10/20/2106), ED (erectile dysfunction), Fibromyalgia, GERD (gastroesophageal reflux disease), Hayfever, Hypertension, Lateral epicondylitis, Paronychia, and Purulent appendicitis (10/23/2016). with    ICD-10-CM   1. PD (Parkinson's disease) (Nathan Delgado) G20   2. OSA on CPAP G47.33    Z99.89   3. Myalgia M79.10    Nathan Delgado continues Neupro for PD with uncertain benefit. He is concerned that he wants to continue long term but wishes to think about this. He will continue working with trainer. He is also working closely with PCP to trial discontinuation of atorvastatin. He is hoping this will help with myalgias. I have offered formal PT therapy for shuffling gait but he wishes to hold off. He will continue Neupro as prescribed and follow up with me in 6 weeks to assess muscle ached after stopping statin. He verbalizes understanding and agreement with this plan.   No orders of the defined types were placed in this encounter.   No orders of the defined types were placed in this encounter.    Follow Up Instructions:  I discussed the assessment and treatment plan with the patient. The patient was  provided an opportunity to ask questions and all were answered. The patient agreed with the plan and demonstrated an understanding of the instructions.   The patient was advised to call back or seek an in-person evaluation if the symptoms worsen or if the condition fails to improve as anticipated.  I provided 20 minutes of non-face-to-face time during this encounter. Patient is located at his place of residence during video conference. Provider is located in the office    Debbora Presto, NP

## 2019-04-16 NOTE — Progress Notes (Signed)
Made any corrections needed, and agree with history, physical, neuro exam,assessment and plan as stated.     Elli Groesbeck, MD [facility]  

## 2019-04-20 ENCOUNTER — Ambulatory Visit: Payer: Medicare Other | Admitting: Adult Health

## 2019-05-05 DIAGNOSIS — L821 Other seborrheic keratosis: Secondary | ICD-10-CM | POA: Diagnosis not present

## 2019-05-05 DIAGNOSIS — D1801 Hemangioma of skin and subcutaneous tissue: Secondary | ICD-10-CM | POA: Diagnosis not present

## 2019-05-05 DIAGNOSIS — Z85828 Personal history of other malignant neoplasm of skin: Secondary | ICD-10-CM | POA: Diagnosis not present

## 2019-05-05 DIAGNOSIS — L570 Actinic keratosis: Secondary | ICD-10-CM | POA: Diagnosis not present

## 2019-05-05 DIAGNOSIS — D225 Melanocytic nevi of trunk: Secondary | ICD-10-CM | POA: Diagnosis not present

## 2019-05-07 ENCOUNTER — Other Ambulatory Visit: Payer: Self-pay | Admitting: Neurology

## 2019-05-07 MED ORDER — ROPINIROLE HCL ER 2 MG PO TB24: 2.0000 mg | ORAL_TABLET | Freq: Every day | ORAL | 3 refills | Status: DC

## 2019-05-07 MED ORDER — GABAPENTIN 300 MG PO CAPS: 300.0000 mg | ORAL_CAPSULE | Freq: Three times a day (TID) | ORAL | 11 refills | Status: DC

## 2019-05-07 NOTE — Progress Notes (Signed)
Gabapentin  Week1: 0.25mg  (1Tab) three times day. ZOXW9: 0.50mg  (2Tab) three times day. UEAV4: 0.75mg  (3Tab) three times day. Week4:1mg  (4Tab) three times day.    0.5 tid for 1 w, 1 mg tid for 1 w, 1.5 mg tid x 1 week, then 2 mg tid? or could go Requip XL once daily  2 mg, then 4 mg then 6 mg

## 2019-05-12 ENCOUNTER — Telehealth: Payer: Self-pay | Admitting: Family Medicine

## 2019-05-12 NOTE — Telephone Encounter (Signed)
Spoke with the patient and he stated that he will slowly wean off the Gabapentin. He stated that he is going to call the pharmacy to see if they still have the prescription for Neupro on file if not he stated that he would call us back.

## 2019-05-12 NOTE — Telephone Encounter (Signed)
Pt has called asking to be able to stop taking gabapentin (NEURONTIN) 300 MG capsule.  Pt states it makes him very sleepy.  Please call

## 2019-05-12 NOTE — Telephone Encounter (Signed)
If he has not taken gabapentin in a while he can just stop. If he has been taking daily, we need to wean slowly. He should still have Neurpro patch. At his last visit he states that he was uncertain of the benefit. I am ok with him continuing it if he feels it helps. We can send new prescription if needed.

## 2019-05-12 NOTE — Telephone Encounter (Signed)
Spoke with the patient and he mentioned that he has only taken the Gabapentin for the last 2 days and that he had his first dose of Requip last night. He stated that he doesn't sleep well at night and during the day he can barely keep his eyes open. He mentioned that he would love to start back taking the Neupro patch. He stated that he tolerated that medication well. Please advise.

## 2019-05-12 NOTE — Telephone Encounter (Signed)
PLease let him know that I am ok with him discontinuing gabapentin. I would recommend weaning slowly. He should decrease tablet by 1 a day each week if he is taking three daily. We also have a 100mg  dose we could try if he feels that 300mg  is too much.

## 2019-05-13 ENCOUNTER — Other Ambulatory Visit: Payer: Self-pay | Admitting: Neurology

## 2019-06-02 ENCOUNTER — Encounter: Payer: Self-pay | Admitting: Family Medicine

## 2019-06-02 ENCOUNTER — Ambulatory Visit (INDEPENDENT_AMBULATORY_CARE_PROVIDER_SITE_OTHER): Payer: Medicare Other | Admitting: Family Medicine

## 2019-06-02 ENCOUNTER — Other Ambulatory Visit: Payer: Self-pay

## 2019-06-02 VITALS — BP 116/70 | HR 77 | Temp 97.7°F | Ht 70.0 in | Wt 222.2 lb

## 2019-06-02 DIAGNOSIS — G20 Parkinson's disease: Secondary | ICD-10-CM

## 2019-06-02 NOTE — Patient Instructions (Signed)
Continue Neupro as prescribed  monitor symptoms, call for any concerns  Follow up in 1 year    Parkinson's Disease Parkinson's disease causes problems with movements. It is a long-term condition. It gets worse over time (is progressive). It affects each person in different ways. It makes it harder for you to:  Control how your body moves.  Move your body normally. The condition can range from mild to very bad (advanced). What are the causes? This condition results from a loss of brain cells called neurons. These brain cells make a chemical called dopamine, which is needed to control body movement. As the condition gets worse, the brain cells make less dopamine. This makes it hard to move or control your movements. The exact cause of this condition is not known. What increases the risk?  Being male.  Being age 20 or older.  Having family members who had Parkinson's disease.  Having had an injury to the brain.  Being very sad (depressed).  Being around things that are harmful or poisonous. What are the signs or symptoms? Symptoms of this condition can vary. The main symptoms have to do with movement. These include:  A tremor or shaking while you are resting that you cannot control.  Stiffness in your neck, arms, and legs.  Slowing of movement. This may include: ? Losing expressions of the face. ? Having trouble making small movements that are needed to button your clothing or brush your teeth.  Walking in a way that is not normal. You may walk with short, shuffling steps.  Loss of balance when standing. You may sway, fall backward, or have trouble making turns. Other symptoms include:  Being very sad, worried, or confused.  Seeing or hearing things that are not real.  Losing thinking abilities (dementia).  Trouble speaking or swallowing.  Having a hard time pooping (constipation).  Needing to pee right away, peeing often, or not being able to control when you pee  or poop.  Sleep problems. How is this treated? There is no cure. The goal of treatment is to manage your symptoms. Treatment may include:  Medicines.  Therapy to help with talking or movement.  Surgery to reduce shaking and other movements that you cannot control. Follow these instructions at home: Medicines  Take over-the-counter and prescription medicines only as told by your doctor.  Avoid taking pain or sleeping medicines. Eating and drinking  Follow instructions from your doctor about what you cannot eat or drink.  Do not drink alcohol. Activity  Talk with your doctor about if it is safe for you to drive.  Do exercises as told by your doctor. Lifestyle      Put in grab bars and railings in your home. These help to prevent falls.  Do not use any products that contain nicotine or tobacco, such as cigarettes, e-cigarettes, and chewing tobacco. If you need help quitting, ask your doctor.  Join a support group. General instructions  Talk with your doctor about what you need help with and what your safety needs are.  Keep all follow-up visits as told by your doctor, including any therapy visits to help with talking or moving. This is important. Contact a doctor if:  Medicines do not help your symptoms.  You feel off-balance.  You fall at home.  You need more help at home.  You have trouble swallowing.  You have a very hard time pooping.  You have a lot of side effects from your medicines.  You feel  very sad, worried, or confused. Get help right away if:  You were hurt in a fall.  You see or hear things that are not real.  You cannot swallow without choking.  You have chest pain or trouble breathing.  You do not feel safe at home.  You have thoughts about hurting yourself or others. If you ever feel like you may hurt yourself or others, or have thoughts about taking your own life, get help right away. You can go to your nearest emergency  department or call:  Your local emergency services (911 in the [place]  A suicide crisis helpline, such as the [facility] at 249-675-2206. This is open 24 hours a day. Summary  This condition causes problems with movements.  It is a long-term condition. It gets worse over time.  There is no cure. Treatment focuses on managing your symptoms.  Talk with your doctor about what you need help with and what your safety needs are.  Keep all follow-up visits as told by your doctor. This is important. This information is not intended to replace advice given to you by your health care provider. Make sure you discuss any questions you have with your health care provider. Document Released: 02/03/2012 Document Revised: 01/28/2019 Document Reviewed: 01/28/2019 Elsevier Patient Education  Nathan Delgado.   Rotigotine transdermal skin patch What is this medicine? ROTIGOTINE (roe TIG oh teen) is used to control the signs and symptoms of Parkinson's disease or restless legs syndrome. This medicine may be used for other purposes; ask your health care provider or pharmacist if you have questions. COMMON BRAND NAME(S): Neupro What should I tell my health care provider before I take this medicine? They need to know if you have any of these conditions:  heart disease  high blood pressure  lung or breathing disease, like asthma  mental illness  skin cancer  sleep disorder  an unusual or allergic reaction to rotigotine, sulfites, other medicines, foods, dyes, or preservatives  pregnant or trying to get pregnant  breast-feeding How should I use this medicine? This medicine is for external use only. Follow the directions on the prescription label. Use exactly as directed. Wash hands after removing and applying this medicine. Change the patch each day at the same time. Apply the patch to an area of the upper arm or body that is clean, dry, and hairless. Do not use  this patch on skin that is injured, irritated, oily, or calloused. Do not apply where the patch will be rubbed by tight clothing or a waistband. Do not apply to the same place more than once every 14 days in order to prevent skin irritation. Do not cut or trim the patch. Take your medicine at regular intervals. Do not take it more often than directed. Do not stop taking except on your doctor's advice. Always remove the old patch before you apply a new one. Remove patch slowly and carefully to avoid irritation. After removal, fold the patch so that it sticks to itself and throw it away. After removal of patch, wash the area with soap and water to remove any drug or adhesive. Baby oil or mineral oil may be used if needed. Do not use alcohol or other liquids. Talk to your pediatrician regarding the use of this medicine in children. Special care may be needed. Overdosage: If you think you have taken too much of this medicine contact a poison control center or emergency room at once. NOTE: This medicine is  only for you. Do not share this medicine with others. What if I miss a dose? If you miss a dose, take it as soon as you can. If it is almost time for your next dose, take only that dose. Do not take double or extra doses. What may interact with this medicine?  alcohol  antihistamines for allergy, cough and cold  certain medicines for sleep  medicines for depression, anxiety, or psychotic disturbances  metoclopramide  narcotic medicines for pain This list may not describe all possible interactions. Give your health care provider a list of all the medicines, herbs, non-prescription drugs, or dietary supplements you use. Also tell them if you smoke, drink alcohol, or use illegal drugs. Some items may interact with your medicine. What should I watch for while using this medicine? Visit your doctor for regular check ups. Tell your doctor or healthcare professional if your symptoms do not start to get  better or if they get worse. You may get drowsy or dizzy. Do not drive, use machinery, or do anything that needs mental alertness until you know how this medicine affects you. Do not stand or sit up quickly, especially if you are an older patient. This reduces the risk of dizzy or fainting spells. Alcohol may interfere with the effect of this medicine. Avoid alcoholic drinks. If you find that you have sudden feelings of wanting to sleep during normal activities, like cooking, watching television, or while driving or riding in a car, you should contact your health care professional. There have been reports of increased sexual urges or other strong urges such as gambling while taking this medicine. If you experience any of these while taking this medicine, you should report this to your health care provider as soon as possible. This medicine patch is sensitive to certain body heat changes. If your skin gets too hot, more medicine will come out of the patch. Call your healthcare provider if you get a fever. Do not take hot baths. Do not sunbathe. Do not use hot tubs, saunas, hair dryers, heating pads, electric blankets, heated waterbeds, or tanning lamps. Do not do exercise that increases your body temperature. If you are going to have a magnetic resonance imaging (MRI) procedure, tell your MRI technician if you have this patch on your body. It must be removed before a MRI. What side effects may I notice from receiving this medicine? Side effects that you should report to your doctor or health care professional as soon as possible:  allergic reactions like skin rash, itching or hives, swelling of the face, lips, or tongue  anxiety, restlessness  breathing problems  confusion  dizziness  falling asleep during normal activities like driving  fast, irregular or slow heartbeat  feeling faint or lightheaded, falls  hallucination, loss of contact with reality  skin irritation, redness, or swelling   uncontrollable head, mouth, neck, arm, or leg movements  uncontrollable and excessive urges (examples: gambling, binge eating, shopping, having sex) Side effects that usually do not require medical attention (report to your doctor or health care professional if they continue or are bothersome):  constipation  difficulty sleeping  headache  loss of appetite  nausea, vomiting  stomach pain  weight gain This list may not describe all possible side effects. Call your doctor for medical advice about side effects. You may report side effects to FDA at 1-800-FDA-1088. Where should I keep my medicine? Keep out of the reach of children. Store at room temperature between 15 and 30  degrees C (59 and 86 degrees F). Keep container tightly closed. Store in original pouch until just before use. Throw away any unused medicine after the expiration date. NOTE: This sheet is a summary. It may not cover all possible information. If you have questions about this medicine, talk to your doctor, pharmacist, or health care provider.  2020 Elsevier/Gold Standard (2016-06-21 15:25:23)

## 2019-06-02 NOTE — Progress Notes (Addendum)
PATIENT: Nathan Delgado DOB: 1941/05/09  REASON FOR VISIT: follow up HISTORY FROM: patient  Chief Complaint  Patient presents with  . Follow-up    2 mon f/u. Alone. Rm 2. Patient mentioned that if he puts that patches on his stomach its painful. He stated that it works better on his arms and legs.      HISTORY OF PRESENT ILLNESS: Today 06/03/19 Nathan Delgado is a 78 y.o. male here today for follow up for concerns of parkinsonism.  He is currently using Neupro patch.  He is uncertain if medication is helping at all but has not noted any worsening of symptoms.  He has noted redness and a burning sensation when patches are placed on abdomen.  He reports that placing Neupro patch on arms or legs seems to be better tolerated.  He denies allergy to latex.  He remains uncertain of diagnosis of Parkinson's disease.  He is aware of his DaTscan results.  He is working with a Physiological scientist daily.  He is also working with orthopedics due to low back and leg pain.  He did try ropinirole and gabapentin together for leg pain but states that this made him feel terribly.  He has not taken this medication recently.  He is seeing his primary care regularly.  HISTORY: (copied from my note on 04/15/2019)   Nathan Delgado is a 78 y.o. male for follow up of PD. He continues Neupro patch. He is uncertain if it helps. He states that the only symptom of PD is shuffling steps. He is uncertain if he wants to continue Neupro but wishes to think about this. He does have mild redness at times where patch was placed. He denies any respiratory symptoms. He is working with his PCP due muscle aches and cramps in his legs. He has stopped atorvastatin for 1 week and has not noticed much of a difference. He is working with a Insurance underwriter. He is having some difficulty staying asleep. He goes to bed around 11:30 and wakes about 3 times a night. He has dry mouth when he wakes. He is managed by Dr Rexene Alberts and Jinny Blossom. He has  appt scheduled for follow up with Megan this month.He is monitoring skin with no changes noted. He denies falls.    Interval history 08/19/2018: Patient returns after DAT scan consistent with Parkinsonian disorder.Discussed parkinsonian disorders with patient, he may have early Parkinson's Disease. Discussed etiology, progression, treatment. Starting treatment early can slow down progression as can exercise and activity. Recommended a complete skin evaluation as skin malignancies seen in PD. Will start Neupro patch, had samples, if he feels it helps will order oral medications.  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   Interval history 06/29/2018: Not walking right, been going on for a few years. He fell over a year ago, his left toe caguht a step and hit his knee. But if he walks around the room he needs to hold onto something. He tends to shuffle, doesn't pick up feet, drags his foot, unsteady when walking, burning on the lateral lower extremities. No weakness. He can push heavy weights at the gym over 200 pounds, not weak legs. No sensory changes in the feet. His right leg has lots of muscle tightness. No tremors. No speech changes. Stable handwriting. No memory or cognitive or behavioral changes. 1st cousin  with PD, father died at 96 with dementia. No problems chewing, swallowing, no wet pillows.   Nathan Delgado:QQPYPP R Med[place]is a 78 y.o.malehere as a referral from Dr. Damita Lack burning thighs. PMHx HTN and anxiety.  He has burning in his anterior lateral thighs, 15 years ago he was diagnosed as having fibromyalgia He ws given clorazepate and taking it for years. Balance is ok. It is the sensation in his legs that is really bothering him. Burning happens every day, happens with sitting or standing. The burning feeling is  symmetrical and in the anterior thighs, radiates a little past the knees. Symptoms have been worsening over the last 3 months. No known inciting factors but he has gained weight as the symptoms worsened. This is not muscle burning, like he went to the gym - it is more superficial. Wellbutrin not helping, was started due to Fibromyalgia. He has anxiety. Symptoms have gotten more severe, more noticeable, more aggravating. Ibuprofen and clorazepate helps. He does not have significant low back pain or radicular symptoms. No bowel or bladder changes. Denies weakness, denies distal paresthesias or numbness in his toes, denies neck pain or any other focal neurologic symptoms.    Reviewed notes, labs and imaging from outside physicians, which showed:   Ct of the head in 2009 showed No acute intracranial abnormalities including mass lesion or mass effect, hydrocephalus, extra-axial fluid collection, midline shift, hemorrhage, or acute infarction, large ischemic events (personally reviewed images)    REVIEW OF SYSTEMS: Out of a complete 14 system review of symptoms, the patient complains only of the following symptoms, muscle cramps and all other reviewed systems are negative.  ALLERGIES: No Known Allergies  HOME MEDICATIONS: Outpatient Medications Prior to Visit  Medication Sig Dispense Refill  . acetaminophen (TYLENOL) 325 MG tablet You can take up to 4000 mg of this per day. (Patient taking differently: as needed. You can take up to 4000 mg of this per day.)    . aspirin 81 MG chewable tablet Chew by mouth.    Nathan Delgado buPROPion (WELLBUTRIN) 75 MG tablet Take 75 mg by mouth daily.    . clorazepate (TRANXENE) 3.75 MG tablet Take 3.75 mg by mouth 2 (two) times daily as needed for anxiety.    Nathan Delgado losartan-hydrochlorothiazide (HYZAAR) 100-12.5 MG tablet     . MAGNESIUM PO Take by mouth.    . Multiple Vitamin (MULTIVITAMIN) tablet Take 1 tablet by mouth daily.    Nathan Delgado NEUPRO 4 MG/24HR PLACE 1 PATCH ONTO THE  SKIN DAILY AS INSTRUCTED 30 patch 1  . omeprazole (PRILOSEC) 20 MG capsule Take by mouth.    Nathan Delgado PRAVASTATIN SODIUM PO Take 1 tablet by mouth daily.    Nathan Delgado UNABLE TO FIND Med Name: Vinegar gummies. 2 or 3 a day. Per patient.    Nathan Delgado atorvastatin (LIPITOR) 40 MG tablet Take 40 mg by mouth daily.  6  . Fish Oil-Cholecalciferol (OMEGA-3 + VITAMIN D3 PO) Take by mouth.    . gabapentin (NEURONTIN) 300 MG capsule Take 1 capsule (300 mg total) by mouth 3 (three) times daily. (Patient not taking: Reported on 06/02/2019) 90 capsule 11  . hydrOXYzine (VISTARIL) 25 MG capsule TAKE ONE CAPSULE BY MOUTH DAILY AS NEEDED FOR ANXIETY  5  . rOPINIRole (REQUIP XL) 2 MG 24 hr tablet Take 1 tablet (2 mg total) by mouth at bedtime. May increase to 4mg  if needed in one week. (Patient not taking: Reported on 06/02/2019) 30 tablet 3   No facility-administered medications prior to visit.  PAST MEDICAL HISTORY: Past Medical History:  Diagnosis Date  . Allergic rhinitis   . Anxiety   . Apnea   . Bilateral leg edema   . Cyst of right kidney    small exophytic cyst, incidental finding along with appendicitis on CT scan  . Difficult intubation 10/20/2106  . ED (erectile dysfunction)   . Fibromyalgia   . GERD (gastroesophageal reflux disease)   . Hayfever   . Hypertension   . Lateral epicondylitis   . Paronychia   . Purulent appendicitis 10/23/2016    PAST SURGICAL HISTORY: Past Surgical History:  Procedure Laterality Date  . CATARACT EXTRACTION, BILATERAL    . COLONOSCOPY  01/2014   normal  . INGUINAL HERNIA REPAIR    . KNEE ARTHROSCOPY    . LAPAROSCOPIC APPENDECTOMY N/A 10/20/2016   Procedure: APPENDECTOMY LAPAROSCOPIC;  Surgeon: Greer Pickerel, MD;  Location: [facility];  Service: General;  Laterality: N/A;  . TONSILLECTOMY      FAMILY HISTORY: Family History  Problem Relation Age of Onset  . Cancer Mother   . Dementia Father   . Depression Father   . Macular degeneration Father   . Neuropathy Neg Hx      SOCIAL HISTORY: Social History   Socioeconomic History  . Marital status: Divorced    Spouse name: Not on file  . Number of children: 2  . Years of education: College  . Highest education level: Not on file  Occupational History  . Occupation: Retired   Scientific laboratory technician  . Financial resource strain: Not on file  . Food insecurity    Worry: Not on file    Inability: Not on file  . Transportation needs    Medical: Not on file    Non-medical: Not on file  Tobacco Use  . Smoking status: Former Smoker    Packs/day: 1.00    Years: 15.00    Pack years: 15.00    Types: Cigarettes    Quit date: 1997    Years since quitting: 23.5  . Smokeless tobacco: Never Used  Substance and Sexual Activity  . Alcohol use: Yes    Alcohol/week: 14.0 standard drinks    Types: 7 Glasses of wine, 7 Shots of liquor per week    Comment: 1-2 drinks per day either 2 wines or scotch and water before dinner and then wine with dinner   . Drug use: No  . Sexual activity: Not Currently    Birth control/protection: Abstinence  Lifestyle  . Physical activity    Days per week: Not on file    Minutes per session: Not on file  . Stress: Not on file  Relationships  . Social Herbalist on phone: Not on file    Gets together: Not on file    Attends religious service: Not on file    Active member of club or organization: Not on file    Attends meetings of clubs or organizations: Not on file    Relationship status: Not on file  . Intimate partner violence    Fear of current or ex partner: Not on file    Emotionally abused: Not on file    Physically abused: Not on file    Forced sexual activity: Not on file  Other Topics Concern  . Not on file  Social History Narrative   Lives at home alone   Caffeine use: 1 cup coffee per day    Left handed      PHYSICAL EXAM  Vitals:   06/02/19 1438  BP: 116/70  Pulse: 77  Temp: 97.7 F (36.5 C)  TempSrc: Oral  Weight: 222 lb 3.2 oz (100.8 kg)   Height: 5\' 10"  (1.778 m)   Body mass index is 31.88 kg/m.  Generalized: Well developed, in no acute distress  Cardiology: normal rate and rhythm, no murmur noted Neurological examination  Mentation: Alert oriented to time, place, history taking. Follows all commands speech and language fluent Cranial nerve II-XII: Pupils were equal round reactive to light. Extraocular movements were full, visual field were full on confrontational test. Facial sensation and strength were normal. Uvula tongue midline. Head turning and shoulder shrug  were normal and symmetric. Motor: The motor testing reveals 5 over 5 strength of all 4 extremities. Good symmetric motor tone is noted throughout. No tremor noted  Sensory: Sensory testing is intact to soft touch on all 4 extremities. No evidence of extinction is noted.  Coordination: Cerebellar testing reveals good finger-nose-finger and heel-to-shin bilaterally.  Gait and station: Gait is short and shuffles. Stooped posture with walking, minimal arm swing,  Romberg is negative. No drift is seen.  Reflexes: Deep tendon reflexes are symmetric and normal bilaterally.   DIAGNOSTIC DATA (LABS, IMAGING, TESTING) - I reviewed patient records, labs, notes, testing and imaging myself where available.  No flowsheet data found.   Lab Results  Component Value Date   WBC 5.7 06/25/2018   HGB 15.5 06/25/2018   HCT 48.4 06/25/2018   MCV 91 06/25/2018   PLT 215 06/25/2018      Component Value Date/Time   NA 143 06/25/2018 1518   K 4.3 06/25/2018 1518   CL 101 06/25/2018 1518   CO2 28 06/25/2018 1518   GLUCOSE 70 06/25/2018 1518   GLUCOSE 119 (H) 10/23/2016 0451   BUN 16 06/25/2018 1518   CREATININE 0.99 06/25/2018 1518   CALCIUM 9.8 06/25/2018 1518   PROT 6.8 06/25/2018 1518   ALBUMIN 4.5 06/25/2018 1518   AST 31 06/25/2018 1518   ALT 41 06/25/2018 1518   ALKPHOS 50 06/25/2018 1518   BILITOT 0.5 06/25/2018 1518   GFRNONAA 74 06/25/2018 1518   GFRAA 85  06/25/2018 1518   No results found for: CHOL, HDL, LDLCALC, LDLDIRECT, TRIG, CHOLHDL No results found for: HGBA1C Lab Results  Component Value Date   VITAMINB12 1,082 06/25/2018   Lab Results  Component Value Date   TSH 1.760 06/25/2018     ASSESSMENT AND PLAN 78 y.o. year old male  has a past medical history of Allergic rhinitis, Anxiety, Apnea, Bilateral leg edema, Cyst of right kidney, Difficult intubation (10/20/2106), ED (erectile dysfunction), Fibromyalgia, GERD (gastroesophageal reflux disease), Hayfever, Hypertension, Lateral epicondylitis, Paronychia, and Purulent appendicitis (10/23/2016). here with     ICD-10-CM   1. Parkinsonism, unspecified Parkinsonism type ([place])  G20     Nassir is stable on Neupro patch.  Although he is uncertain of diagnosis, he does wish to continue recommended treatment.  He will continue working with his Physiological scientist.  We have also discussed physical therapy should he choose to do so.  We will discontinue ropinirole and gabapentin as these medications cause side effects.  We have discussed his leg pain in detail.  He feels that this is most likely related to his back.  Pain does not sound consistent with restless leg.  He will continue Neupro patch as directed.  I have advised regular follow-up. We will see him back annually, sooner if needed.  He verbalizes understanding and agreement  with this plan.   No orders of the defined types were placed in this encounter.    No orders of the defined types were placed in this encounter.  Addendum 06/04/2019: Patient with Parkinson's diasease. 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  Sarina Ill MD  I spent 15 minutes with the patient. 50% of this time was spent counseling and educating patient on plan of care and  medications.    Debbora Presto, FNP-C 06/03/2019, 9:26 PM [facility] [address], [place], [place] [address] [phone]

## 2019-06-03 ENCOUNTER — Encounter: Payer: Self-pay | Admitting: Family Medicine

## 2019-06-04 NOTE — Progress Notes (Addendum)
Made any corrections needed, and agree with history, physical, neuro exam,assessment and plan as stated.    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   Nathan Ill, MD [facility]

## 2019-06-15 DIAGNOSIS — R293 Abnormal posture: Secondary | ICD-10-CM | POA: Diagnosis not present

## 2019-06-15 DIAGNOSIS — R262 Difficulty in walking, not elsewhere classified: Secondary | ICD-10-CM | POA: Diagnosis not present

## 2019-06-15 DIAGNOSIS — R2689 Other abnormalities of gait and mobility: Secondary | ICD-10-CM | POA: Diagnosis not present

## 2019-06-15 DIAGNOSIS — R2681 Unsteadiness on feet: Secondary | ICD-10-CM | POA: Diagnosis not present

## 2019-06-18 DIAGNOSIS — R293 Abnormal posture: Secondary | ICD-10-CM | POA: Diagnosis not present

## 2019-06-18 DIAGNOSIS — R2689 Other abnormalities of gait and mobility: Secondary | ICD-10-CM | POA: Diagnosis not present

## 2019-06-18 DIAGNOSIS — R262 Difficulty in walking, not elsewhere classified: Secondary | ICD-10-CM | POA: Diagnosis not present

## 2019-06-18 DIAGNOSIS — R2681 Unsteadiness on feet: Secondary | ICD-10-CM | POA: Diagnosis not present

## 2019-06-22 DIAGNOSIS — R2681 Unsteadiness on feet: Secondary | ICD-10-CM | POA: Diagnosis not present

## 2019-06-22 DIAGNOSIS — R2689 Other abnormalities of gait and mobility: Secondary | ICD-10-CM | POA: Diagnosis not present

## 2019-06-22 DIAGNOSIS — R293 Abnormal posture: Secondary | ICD-10-CM | POA: Diagnosis not present

## 2019-06-22 DIAGNOSIS — R262 Difficulty in walking, not elsewhere classified: Secondary | ICD-10-CM | POA: Diagnosis not present

## 2019-06-24 DIAGNOSIS — R2681 Unsteadiness on feet: Secondary | ICD-10-CM | POA: Diagnosis not present

## 2019-06-24 DIAGNOSIS — R2689 Other abnormalities of gait and mobility: Secondary | ICD-10-CM | POA: Diagnosis not present

## 2019-06-24 DIAGNOSIS — R262 Difficulty in walking, not elsewhere classified: Secondary | ICD-10-CM | POA: Diagnosis not present

## 2019-06-24 DIAGNOSIS — R293 Abnormal posture: Secondary | ICD-10-CM | POA: Diagnosis not present

## 2019-06-29 DIAGNOSIS — R262 Difficulty in walking, not elsewhere classified: Secondary | ICD-10-CM | POA: Diagnosis not present

## 2019-06-29 DIAGNOSIS — R293 Abnormal posture: Secondary | ICD-10-CM | POA: Diagnosis not present

## 2019-06-29 DIAGNOSIS — R2681 Unsteadiness on feet: Secondary | ICD-10-CM | POA: Diagnosis not present

## 2019-06-29 DIAGNOSIS — R2689 Other abnormalities of gait and mobility: Secondary | ICD-10-CM | POA: Diagnosis not present

## 2019-07-01 DIAGNOSIS — R293 Abnormal posture: Secondary | ICD-10-CM | POA: Diagnosis not present

## 2019-07-01 DIAGNOSIS — R262 Difficulty in walking, not elsewhere classified: Secondary | ICD-10-CM | POA: Diagnosis not present

## 2019-07-01 DIAGNOSIS — R2681 Unsteadiness on feet: Secondary | ICD-10-CM | POA: Diagnosis not present

## 2019-07-01 DIAGNOSIS — R2689 Other abnormalities of gait and mobility: Secondary | ICD-10-CM | POA: Diagnosis not present

## 2019-07-05 ENCOUNTER — Telehealth: Payer: Self-pay | Admitting: Neurology

## 2019-07-05 NOTE — Telephone Encounter (Addendum)
Compliance report printed. Dr. Rexene Alberts out of office currently.

## 2019-07-05 NOTE — Telephone Encounter (Signed)
Pt left a message stating he was under the impression that after starting the cpap machine he would be sleeping better. Pt said he is only sleeping 3 1/2 to 5 hours a night. He would like for someone to call him back and let him know what his download is showing on how many hours he sleeps a night.

## 2019-07-06 DIAGNOSIS — R2689 Other abnormalities of gait and mobility: Secondary | ICD-10-CM | POA: Diagnosis not present

## 2019-07-06 DIAGNOSIS — R262 Difficulty in walking, not elsewhere classified: Secondary | ICD-10-CM | POA: Diagnosis not present

## 2019-07-06 DIAGNOSIS — R293 Abnormal posture: Secondary | ICD-10-CM | POA: Diagnosis not present

## 2019-07-06 DIAGNOSIS — R2681 Unsteadiness on feet: Secondary | ICD-10-CM | POA: Diagnosis not present

## 2019-07-06 NOTE — Telephone Encounter (Addendum)
I spoke with the patient and advised his CPAP report showed he is sleeping on average 6 hr 41 min per night. Pt stated he wakes up off and on during the night and wakes up with a stuffy nose. He doesn't feel rested. This started in the last 6 months. Pt's last f/u for CPAP was 03/2018. I scheduled pt for an appt with Amy on Mon 8/17 @ 3:00 arrival 15-30 minutes early. He verbalized appreciation and understands to bring a facial covering to his appt.

## 2019-07-08 DIAGNOSIS — R2681 Unsteadiness on feet: Secondary | ICD-10-CM | POA: Diagnosis not present

## 2019-07-08 DIAGNOSIS — R262 Difficulty in walking, not elsewhere classified: Secondary | ICD-10-CM | POA: Diagnosis not present

## 2019-07-08 DIAGNOSIS — R2689 Other abnormalities of gait and mobility: Secondary | ICD-10-CM | POA: Diagnosis not present

## 2019-07-08 DIAGNOSIS — R293 Abnormal posture: Secondary | ICD-10-CM | POA: Diagnosis not present

## 2019-07-12 ENCOUNTER — Ambulatory Visit (INDEPENDENT_AMBULATORY_CARE_PROVIDER_SITE_OTHER): Payer: Medicare Other | Admitting: Family Medicine

## 2019-07-12 ENCOUNTER — Encounter: Payer: Self-pay | Admitting: Family Medicine

## 2019-07-12 ENCOUNTER — Other Ambulatory Visit: Payer: Self-pay

## 2019-07-12 VITALS — BP 140/62 | HR 86 | Temp 99.2°F | Wt 238.0 lb

## 2019-07-12 DIAGNOSIS — G4733 Obstructive sleep apnea (adult) (pediatric): Secondary | ICD-10-CM | POA: Diagnosis not present

## 2019-07-12 DIAGNOSIS — Z9989 Dependence on other enabling machines and devices: Secondary | ICD-10-CM | POA: Diagnosis not present

## 2019-07-12 NOTE — Progress Notes (Addendum)
PATIENT: Nathan Delgado DOB: 1941-01-21  REASON FOR VISIT: follow up HISTORY FROM: patient  Chief Complaint  Patient presents with   Follow-up    CIPAP follow up room 1 pt with      HISTORY OF PRESENT ILLNESS: Today 07/12/19 Nathan Delgado is a 78 y.o. male here today for follow up of OSA on CPAP.  He reports that he is using CPAP nightly.  He has noted in the last couple of months that he has more of a dry mouth.  He also wakes earlier in the mornings than usual.  He started Wellbutrin as recommended by his PCP around the same timeframe.  He is uncertain if this is related.  Compliance report dated 06/07/2019 through 07/06/2019 reveals that he is using CPAP 30 out of the last 30 days for compliance of 100%.  All 30 days he used his CPAP greater than 4 hours for compliance of 90%.  AHI was 1.3 on 10 cm of water and an EPR of 3.  There was no significant leak.  HISTORY: (copied from Dr Guadelupe Sabin note on 03/25/2017)  Interim history:   Nathan Delgado is a 78 year old right-handed gentleman with an underlying medical history of allergic rhinitis, hypertension, fibromyalgia, reflux disease, ED, anxiety and obesity, who presents for follow-up consultation of his OSA. The patient is unaccompanied today. I last saw him on 09/24/2016, at which time he reported doing fairly well, was adjusting to treatment and motivated to continue with CPAP therapy. He was compliant with treatment. His blood pressure improved after change in his medication. He denied any restless leg symptoms but did endorse hip pain when he was here for the sleep studies.  Today, 03/25/2017 (all dictated new, as well as above notes, some dictation done in note pad or Word, outside of chart, may appear as copied): I reviewed his CPAP compliance data from 02/22/2017 through 03/23/2017, which is a total of 30 days, during which time he used his CPAP every night with percent used days greater than 4 hours at 100%, indicating superb  compliance with an average usage of 8 hours and 2 minutes, residual AHI 0.9 per hour, leak acceptable with the 95th percentile at 8.9 L/m on a pressure of 10 cm with EPR of 3. He reports doing well with CPAP, working well for him, tolerating the interface and pressure. Has had appendicitis and had AE on 10/20/16. He has bouts of anxiety for which he takes tranxene as needed. He works out 3 days a week with a Physiological scientist, Annice Pih. His sleep is less interrupted. Nocturia is later at night now, rather than earlier in the night. Has Received supplies on a regular basis. Has no new complaints with the exception that he has balance problems and feeling of dizziness, he has had workup for this, has seen ENT, has had his eyes checked, has no significant positional vertigo. He feels as if he has fullness in his head. He has a longer standing history of anxiety which is managed with Wellbutrin once daily and as needed Tranxene.   The patient's allergies, current medications, family history, past medical history, past social history, past surgical history and problem list were reviewed and updated as appropriate.   Previously (copied from previous notes for reference):   I first met him on 05/21/2016 at the request of his primary care physician, at which time he reported nonrestorative sleep, sleep disruption, and nocturia. I invited him for a sleep study. He had a  baseline sleep study followed by a CPAP titration study. I went over his test results with him in detail today. His baseline sleep study on 06/10/2016 showed a sleep efficiency of 78.4% with a latency to sleep of 17 minutes and wake after sleep onset of 83 minutes with moderate sleep fragmentation noted. He had an elevated arousal index. He had absence of slow-wave sleep and REM sleep was 40.5% with a normal REM latency. He had PLMS with an index of 25.5 per hour, resulting in only 2.7 arousals per hour. He had mild to moderate snoring. Supine REM  sleep was not achieved. Total AHI was 12.6 per hour, rising to 22.9 per hour during REM sleep. Average oxygen saturation was 92%, nadir was 81%. Time below 90% saturation was 47 minutes. Based on his test results and his sleep related complaints I invited him for a full night CPAP titration study. He had this on 07/01/2016. Sleep efficiency was 83.4%, sleep latency 13.5 minutes, wake after sleep onset was 51 minutes with mild to moderate sleep fragmentation noted. He had a high normal arousal index. He had absence of slow-wave sleep, REM sleep was 18.9% with a REM latency of 69 minutes. He had very mild PLMS with an index of 12.4 per hour, resulting in 3.5 arousals per hour. CPAP was started at 5 cm using a nasal mask. CPAP was titrated to 13 cm. AHI was 1.4 per hour at a pressure of 10 cm. Brief supine REM sleep was achieved. Based on his test results are prescribed CPAP therapy for home use.  I reviewed his CPAP compliance data from 08/24/2016 through 09/22/2016 which is a total of 30 days, during which time he used his machine every night with percent used days greater than 4 hours at 100%, indicating superb compliance with an average usage of 8 hours and 20 minutes, residual AHI 1.4 per hour, leak low on the higher end of normal with the 95th percentile at 21.6 L per min and a pressure of 10 cm with EPR of 3.  05/21/2016: He reports sleep disruption, nocturia and non-restorative sleep. He does not know if he snores. He is divorced (x2) and lives alone. He reports difficulty initiating and maintaining sleep. I reviewed your office note from 05/06/2016, which you kindly included.  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 He has 2 grown children, 4 grandchildren. He is retired. He quit smoking in 1983. He drinks about 2 alcoholic beverages per day, coffee 1-2 cups per day. He goes to bed around 11 PM and watches TV, puts it on a timer. Usually turns the TV off before the time and turns it off. He  falls asleep fairly quickly. Until about a month ago he had significant sleep disruption, particularly from having to go to the bathroom 3-4 times on an average night. Since his recent medication changes he has had improvement in his sleep disruption and nocturia and gets up on average once per night. His wake up time is around 7:30 to 8 AM. He feels adequately or reasonably rested when he first wakes up. He denies restless leg symptoms or morning headaches. He does not take any naps. Sometimes when he comes home after working out with a trainer he doses off briefly. His recent medication changes include discontinuation of amlodipine and Lyrica, reduction in Wellbutrin, new blood pressure medication and he was then restarted on low-dose Lyrica, currently at 50 mg twice daily with good tolerance. He had some intermittent  tingling which have improved. He has some symptoms in keeping with a carpal tunnel syndrome. His weight has been stable for the past 5-7 years. Epworth Sleepiness Scale score is 2 out of 24 today, his fatigue score is 11 out of 63.   REVIEW OF SYSTEMS: Out of a complete 14 system review of symptoms, the patient complains only of the following symptoms, none and all other reviewed systems are negative.  Epworth sleepiness scale: 3 Fatigue severity scale: 18  ALLERGIES: No Known Allergies  HOME MEDICATIONS: Outpatient Medications Prior to Visit  Medication Sig Dispense Refill   acetaminophen (TYLENOL) 325 MG tablet You can take up to 4000 mg of this per day. (Patient taking differently: as needed. You can take up to 4000 mg of this per day.)     aspirin 81 MG chewable tablet Chew by mouth.     atorvastatin (LIPITOR) 40 MG tablet Take 40 mg by mouth daily.  6   buPROPion (WELLBUTRIN SR) 100 MG 12 hr tablet TAKE 1 TABLET BY MOUTH EVERY DAY IN THE MORNING     buPROPion (WELLBUTRIN) 75 MG tablet Take 75 mg by mouth daily.     clorazepate (TRANXENE) 3.75 MG tablet Take 3.75 mg by  mouth 2 (two) times daily as needed for anxiety.     Fish Oil-Cholecalciferol (OMEGA-3 + VITAMIN D3 PO) Take by mouth.     gabapentin (NEURONTIN) 300 MG capsule Take 1 capsule (300 mg total) by mouth 3 (three) times daily. 90 capsule 11   hydrOXYzine (VISTARIL) 25 MG capsule TAKE ONE CAPSULE BY MOUTH DAILY AS NEEDED FOR ANXIETY  5   losartan-hydrochlorothiazide (HYZAAR) 100-12.5 MG tablet      MAGNESIUM PO Take by mouth.     Multiple Vitamin (MULTIVITAMIN) tablet Take 1 tablet by mouth daily.     NEUPRO 4 MG/24HR PLACE 1 PATCH ONTO THE SKIN DAILY AS INSTRUCTED 30 patch 1   omeprazole (PRILOSEC) 20 MG capsule Take by mouth.     PRAVASTATIN SODIUM PO Take 1 tablet by mouth daily.     rOPINIRole (REQUIP XL) 2 MG 24 hr tablet Take 1 tablet (2 mg total) by mouth at bedtime. May increase to 35m if needed in one week. 30 tablet 3   UNABLE TO FIND Med Name: Vinegar gummies. 2 or 3 a day. Per patient.     No facility-administered medications prior to visit.     PAST MEDICAL HISTORY: Past Medical History:  Diagnosis Date   Allergic rhinitis    Anxiety    Apnea    Bilateral leg edema    Cyst of right kidney    small exophytic cyst, incidental finding along with appendicitis on CT scan   Difficult intubation 10/20/2106   ED (erectile dysfunction)    Fibromyalgia    GERD (gastroesophageal reflux disease)    Hayfever    Hypertension    Lateral epicondylitis    Paronychia    Purulent appendicitis 10/23/2016    PAST SURGICAL HISTORY: Past Surgical History:  Procedure Laterality Date   CATARACT EXTRACTION, BILATERAL     COLONOSCOPY  01/2014   normal   INGUINAL HERNIA REPAIR     KNEE ARTHROSCOPY     LAPAROSCOPIC APPENDECTOMY N/A 10/20/2016   Procedure: APPENDECTOMY LAPAROSCOPIC;  Surgeon: EGreer Pickerel MD;  Location: [facility];  Service: General;  Laterality: N/A;   TONSILLECTOMY      FAMILY HISTORY: Family History  Problem Relation Age of Onset   Cancer  Mother  Dementia Father    Depression Father    Macular degeneration Father    Neuropathy Neg Hx     SOCIAL HISTORY: Social History   Socioeconomic History   Marital status: Divorced    Spouse name: Not on file   Number of children: 2   Years of education: College   Highest education level: Not on file  Occupational History   Occupation: Retired   Scientist, product/process development strain: Not on file   Food insecurity    Worry: Not on file    Inability: Not on Lexicographer needs    Medical: Not on file    Non-medical: Not on file  Tobacco Use   Smoking status: Former Smoker    Packs/day: 1.00    Years: 15.00    Pack years: 15.00    Types: Cigarettes    Quit date: 1997    Years since quitting: 23.6   Smokeless tobacco: Never Used  Substance and Sexual Activity   Alcohol use: Yes    Alcohol/week: 14.0 standard drinks    Types: 7 Glasses of wine, 7 Shots of liquor per week    Comment: 1-2 drinks per day either 2 wines or scotch and water before dinner and then wine with dinner    Drug use: No   Sexual activity: Not Currently    Birth control/protection: Abstinence  Lifestyle   Physical activity    Days per week: Not on file    Minutes per session: Not on file   Stress: Not on file  Relationships   Social connections    Talks on phone: Not on file    Gets together: Not on file    Attends religious service: Not on file    Active member of club or organization: Not on file    Attends meetings of clubs or organizations: Not on file    Relationship status: Not on file   Intimate partner violence    Fear of current or ex partner: Not on file    Emotionally abused: Not on file    Physically abused: Not on file    Forced sexual activity: Not on file  Other Topics Concern   Not on file  Social History Narrative   Lives at home alone   Caffeine use: 1 cup coffee per day    Left handed      PHYSICAL EXAM  Vitals:   07/12/19  1456  BP: 140/62  Delgado: 86  Temp: 99.2 F (37.3 C)  Weight: 238 lb (108 kg)   Body mass index is 34.15 kg/m.  Generalized: Well developed, in no acute distress  Mallampati 4+, neck circ 18" Neurological examination  Mentation: Alert oriented to time, place, history taking. Follows all commands speech and language fluent Cranial nerve II-XII: Pupils were equal round reactive to light. Extraocular movements were full, visual field were full on confrontational test. Facial sensation and strength were normal. Uvula tongue midline. Head turning and shoulder shrug  were normal and symmetric. Motor: The motor testing reveals 5 over 5 strength of all 4 extremities. Good symmetric motor tone is noted throughout.    DIAGNOSTIC DATA (LABS, IMAGING, TESTING) - I reviewed patient records, labs, notes, testing and imaging myself where available.  No flowsheet data found.   Lab Results  Component Value Date   WBC 5.7 06/25/2018   HGB 15.5 06/25/2018   HCT 48.4 06/25/2018   MCV 91 06/25/2018   PLT 215 06/25/2018  Component Value Date/Time   NA 143 06/25/2018 1518   K 4.3 06/25/2018 1518   CL 101 06/25/2018 1518   CO2 28 06/25/2018 1518   GLUCOSE 70 06/25/2018 1518   GLUCOSE 119 (H) 10/23/2016 0451   BUN 16 06/25/2018 1518   CREATININE 0.99 06/25/2018 1518   CALCIUM 9.8 06/25/2018 1518   PROT 6.8 06/25/2018 1518   ALBUMIN 4.5 06/25/2018 1518   AST 31 06/25/2018 1518   ALT 41 06/25/2018 1518   ALKPHOS 50 06/25/2018 1518   BILITOT 0.5 06/25/2018 1518   GFRNONAA 74 06/25/2018 1518   GFRAA 85 06/25/2018 1518   No results found for: CHOL, HDL, LDLCALC, LDLDIRECT, TRIG, CHOLHDL No results found for: HGBA1C Lab Results  Component Value Date   VITAMINB12 1,082 06/25/2018   Lab Results  Component Value Date   TSH 1.760 06/25/2018       ASSESSMENT AND PLAN 78 y.o. year old male  has a past medical history of Allergic rhinitis, Anxiety, Apnea, Bilateral leg edema, Cyst of  right kidney, Difficult intubation (10/20/2106), ED (erectile dysfunction), Fibromyalgia, GERD (gastroesophageal reflux disease), Hayfever, Hypertension, Lateral epicondylitis, Paronychia, and Purulent appendicitis (10/23/2016). here with     ICD-10-CM   1. OSA on CPAP  G47.33    Z99.89     Lj is doing well on CPAP therapy.  Compliance report reveals optimal compliance.  He was encouraged to continue using CPAP therapy nightly and for greater than 4 hours each night.  I am uncertain if concerns of dry mouth may be related to starting Wellbutrin.  I have advised that he discuss this with Dr. Sharlett Iles.  If he feels that Wellbutrin is helping symptoms, I advised that he continue if recommended by PCP.  He continues to have multiple questions regarding his Parkinson's medications.  I have advised that he follow-up directly with Dr. Jaynee Eagles with these concerns.  I have addressed these with Toula Moos in office on 2 separate occasions, however, questions remain.  I will notify Dr. Lavell Anchors as well.  We will follow-up annually for CPAP review, sooner if needed.   No orders of the defined types were placed in this encounter.    No orders of the defined types were placed in this encounter.     I spent 15 minutes with the patient. 50% of this time was spent counseling and educating patient on plan of care and medications.    Debbora Presto, FNP-C 07/12/2019, 3:58 PM [facility] [address], [place], [place] [address] [phone]  I reviewed the above note and documentation by the Nurse Practitioner and agree with the history, exam, assessment and plan as outlined above. I was immediately available for consultation. Star Age, MD, PhD [facility])

## 2019-07-12 NOTE — Patient Instructions (Signed)
Continue CPAP nightly and for greater than 4 hours each night  Follow up annually  Sleep Apnea Sleep apnea affects breathing during sleep. It causes breathing to stop for a short time or to become shallow. It can also increase the risk of:  Heart attack.  Stroke.  Being very overweight (obese).  Diabetes.  Heart failure.  Irregular heartbeat. The goal of treatment is to help you breathe normally again. What are the causes? There are three kinds of sleep apnea:  Obstructive sleep apnea. This is caused by a blocked or collapsed airway.  Central sleep apnea. This happens when the brain does not send the right signals to the muscles that control breathing.  Mixed sleep apnea. This is a combination of obstructive and central sleep apnea. The most common cause of this condition is a collapsed or blocked airway. This can happen if:  Your throat muscles are too relaxed.  Your tongue and tonsils are too large.  You are overweight.  Your airway is too small. What increases the risk?  Being overweight.  Smoking.  Having a small airway.  Being older.  Being male.  Drinking alcohol.  Taking medicines to calm yourself (sedatives or tranquilizers).  Having family members with the condition. What are the signs or symptoms?  Trouble staying asleep.  Being sleepy or tired during the day.  Getting angry a lot.  Loud snoring.  Headaches in the morning.  Not being able to focus your mind (concentrate).  Forgetting things.  Less interest in sex.  Mood swings.  Personality changes.  Feelings of sadness (depression).  Waking up a lot during the night to pee (urinate).  Dry mouth.  Sore throat. How is this diagnosed?  Your medical history.  A physical exam.  A test that is done when you are sleeping (sleep study). The test is most often done in a sleep lab but may also be done at home. How is this treated?   Sleeping on your side.  Using a  medicine to get rid of mucus in your nose (decongestant).  Avoiding the use of alcohol, medicines to help you relax, or certain pain medicines (narcotics).  Losing weight, if needed.  Changing your diet.  Not smoking.  Using a machine to open your airway while you sleep, such as: ? An oral appliance. This is a mouthpiece that shifts your lower jaw forward. ? A CPAP device. This device blows air through a mask when you breathe out (exhale). ? An EPAP device. This has valves that you put in each nostril. ? A BPAP device. This device blows air through a mask when you breathe in (inhale) and breathe out.  Having surgery if other treatments do not work. It is important to get treatment for sleep apnea. Without treatment, it can lead to:  High blood pressure.  Coronary artery disease.  In men, not being able to have an erection (impotence).  Reduced thinking ability. Follow these instructions at home: Lifestyle  Make changes that your doctor recommends.  Eat a healthy diet.  Lose weight if needed.  Avoid alcohol, medicines to help you relax, and some pain medicines.  Do not use any products that contain nicotine or tobacco, such as cigarettes, e-cigarettes, and chewing tobacco. If you need help quitting, ask your doctor. General instructions  Take over-the-counter and prescription medicines only as told by your doctor.  If you were given a machine to use while you sleep, use it only as told by your doctor.  If you are having surgery, make sure to tell your doctor you have sleep apnea. You may need to bring your device with you.  Keep all follow-up visits as told by your doctor. This is important. Contact a doctor if:  The machine that you were given to use during sleep bothers you or does not seem to be working.  You do not get better.  You get worse. Get help right away if:  Your chest hurts.  You have trouble breathing in enough air.  You have an uncomfortable  feeling in your back, arms, or stomach.  You have trouble talking.  One side of your body feels weak.  A part of your face is hanging down. These symptoms may be an emergency. Do not wait to see if the symptoms will go away. Get medical help right away. Call your local emergency services (911 in the [place] Do not drive yourself to the hospital. Summary  This condition affects breathing during sleep.  The most common cause is a collapsed or blocked airway.  The goal of treatment is to help you breathe normally while you sleep. This information is not intended to replace advice given to you by your health care provider. Make sure you discuss any questions you have with your health care provider. Document Released: 08/20/2008 Document Revised: 08/28/2018 Document Reviewed: 07/07/2018 Elsevier Patient Education  [place]. CPAP and BPAP Information CPAP and BPAP are methods of helping a person breathe with the use of air pressure. CPAP stands for "continuous positive airway pressure." BPAP stands for "bi-level positive airway pressure." In both methods, air is blown through your nose or mouth and into your air passages to help you breathe well. CPAP and BPAP use different amounts of pressure to blow air. With CPAP, the amount of pressure stays the same while you breathe in and out. With BPAP, the amount of pressure is increased when you breathe in (inhale) so that you can take larger breaths. Your health care provider will recommend whether CPAP or BPAP would be more helpful for you. Why are CPAP and BPAP treatments used? CPAP or BPAP can be helpful if you have:  Sleep apnea.  Chronic obstructive pulmonary disease (COPD).  Heart failure.  Medical conditions that weaken the muscles of the chest including muscular dystrophy, or neurological diseases such as amyotrophic lateral sclerosis (ALS).  Other problems that cause breathing to be weak, abnormal, or difficult. CPAP is most  commonly used for obstructive sleep apnea (OSA) to keep the airways from collapsing when the muscles relax during sleep. How is CPAP or BPAP administered? Both CPAP and BPAP are provided by a small machine with a flexible plastic tube that attaches to a plastic mask. You wear the mask. Air is blown through the mask into your nose or mouth. The amount of pressure that is used to blow the air can be adjusted on the machine. Your health care provider will determine the pressure setting that should be used based on your individual needs. When should CPAP or BPAP be used? In most cases, the mask only needs to be worn during sleep. Generally, the mask needs to be worn throughout the night and during any daytime naps. People with certain medical conditions may also need to wear the mask at other times when they are awake. Follow instructions from your health care provider about when to use the machine. What are some tips for using the mask?   Because the mask needs to be  snug, some people feel trapped or closed-in (claustrophobic) when first using the mask. If you feel this way, you may need to get used to the mask. One way to do this is by holding the mask loosely over your nose or mouth and then gradually applying the mask more snugly. You can also gradually increase the amount of time that you use the mask.  Masks are available in various types and sizes. Some fit over your mouth and nose while others fit over just your nose. If your mask does not fit well, talk with your health care provider about getting a different one.  If you are using a mask that fits over your nose and you tend to breathe through your mouth, a chin strap may be applied to help keep your mouth closed.  The CPAP and BPAP machines have alarms that may sound if the mask comes off or develops a leak.  If you have trouble with the mask, it is very important that you talk with your health care provider about finding a way to make the  mask easier to tolerate. Do not stop using the mask. Stopping the use of the mask could have a negative impact on your health. What are some tips for using the machine?  Place your CPAP or BPAP machine on a secure table or stand near an electrical outlet.  Know where the on/off switch is located on the machine.  Follow instructions from your health care provider about how to set the pressure on your machine and when you should use it.  Do not eat or drink while the CPAP or BPAP machine is on. Food or fluids could get pushed into your lungs by the pressure of the CPAP or BPAP.  Do not smoke. Tobacco smoke residue can damage the machine.  For home use, CPAP and BPAP machines can be rented or purchased through home health care companies. Many different brands of machines are available. Renting a machine before purchasing may help you find out which particular machine works well for you.  Keep the CPAP or BPAP machine and attachments clean. Ask your health care provider for specific instructions. Get help right away if:  You have redness or open areas around your nose or mouth where the mask fits.  You have trouble using the CPAP or BPAP machine.  You cannot tolerate wearing the CPAP or BPAP mask.  You have pain, discomfort, and bloating in your abdomen. Summary  CPAP and BPAP are methods of helping a person breathe with the use of air pressure.  Both CPAP and BPAP are provided by a small machine with a flexible plastic tube that attaches to a plastic mask.  If you have trouble with the mask, it is very important that you talk with your health care provider about finding a way to make the mask easier to tolerate. This information is not intended to replace advice given to you by your health care provider. Make sure you discuss any questions you have with your health care provider. Document Released: 08/09/2004 Document Revised: 03/03/2019 Document Reviewed: 09/30/2016 Elsevier Patient  Education  2020 [employer].

## 2019-07-13 ENCOUNTER — Other Ambulatory Visit: Payer: Self-pay | Admitting: *Deleted

## 2019-07-13 MED ORDER — NEUPRO 4 MG/24HR TD PT24: MEDICATED_PATCH | TRANSDERMAL | 1 refills | Status: DC

## 2019-07-15 DIAGNOSIS — R293 Abnormal posture: Secondary | ICD-10-CM | POA: Diagnosis not present

## 2019-07-15 DIAGNOSIS — R262 Difficulty in walking, not elsewhere classified: Secondary | ICD-10-CM | POA: Diagnosis not present

## 2019-07-15 DIAGNOSIS — R2689 Other abnormalities of gait and mobility: Secondary | ICD-10-CM | POA: Diagnosis not present

## 2019-07-15 DIAGNOSIS — R2681 Unsteadiness on feet: Secondary | ICD-10-CM | POA: Diagnosis not present

## 2019-07-16 DIAGNOSIS — R2689 Other abnormalities of gait and mobility: Secondary | ICD-10-CM | POA: Diagnosis not present

## 2019-07-16 DIAGNOSIS — R293 Abnormal posture: Secondary | ICD-10-CM | POA: Diagnosis not present

## 2019-07-16 DIAGNOSIS — R2681 Unsteadiness on feet: Secondary | ICD-10-CM | POA: Diagnosis not present

## 2019-07-16 DIAGNOSIS — R262 Difficulty in walking, not elsewhere classified: Secondary | ICD-10-CM | POA: Diagnosis not present

## 2019-07-20 DIAGNOSIS — R262 Difficulty in walking, not elsewhere classified: Secondary | ICD-10-CM | POA: Diagnosis not present

## 2019-07-20 DIAGNOSIS — R2689 Other abnormalities of gait and mobility: Secondary | ICD-10-CM | POA: Diagnosis not present

## 2019-07-20 DIAGNOSIS — R2681 Unsteadiness on feet: Secondary | ICD-10-CM | POA: Diagnosis not present

## 2019-07-20 DIAGNOSIS — R293 Abnormal posture: Secondary | ICD-10-CM | POA: Diagnosis not present

## 2019-07-22 DIAGNOSIS — R293 Abnormal posture: Secondary | ICD-10-CM | POA: Diagnosis not present

## 2019-07-22 DIAGNOSIS — R2689 Other abnormalities of gait and mobility: Secondary | ICD-10-CM | POA: Diagnosis not present

## 2019-07-22 DIAGNOSIS — R2681 Unsteadiness on feet: Secondary | ICD-10-CM | POA: Diagnosis not present

## 2019-07-22 DIAGNOSIS — R262 Difficulty in walking, not elsewhere classified: Secondary | ICD-10-CM | POA: Diagnosis not present

## 2019-07-27 DIAGNOSIS — R2681 Unsteadiness on feet: Secondary | ICD-10-CM | POA: Diagnosis not present

## 2019-07-27 DIAGNOSIS — R2689 Other abnormalities of gait and mobility: Secondary | ICD-10-CM | POA: Diagnosis not present

## 2019-07-27 DIAGNOSIS — R293 Abnormal posture: Secondary | ICD-10-CM | POA: Diagnosis not present

## 2019-07-27 DIAGNOSIS — R262 Difficulty in walking, not elsewhere classified: Secondary | ICD-10-CM | POA: Diagnosis not present

## 2019-08-03 ENCOUNTER — Encounter: Payer: Self-pay | Admitting: Orthopaedic Surgery

## 2019-08-03 ENCOUNTER — Other Ambulatory Visit: Payer: Self-pay

## 2019-08-03 ENCOUNTER — Ambulatory Visit (INDEPENDENT_AMBULATORY_CARE_PROVIDER_SITE_OTHER): Payer: Medicare Other | Admitting: Orthopaedic Surgery

## 2019-08-03 ENCOUNTER — Ambulatory Visit (INDEPENDENT_AMBULATORY_CARE_PROVIDER_SITE_OTHER): Payer: Medicare Other

## 2019-08-03 VITALS — BP 116/63 | HR 60 | Ht 70.0 in | Wt 230.0 lb

## 2019-08-03 DIAGNOSIS — R2689 Other abnormalities of gait and mobility: Secondary | ICD-10-CM | POA: Diagnosis not present

## 2019-08-03 DIAGNOSIS — M545 Low back pain, unspecified: Secondary | ICD-10-CM

## 2019-08-03 DIAGNOSIS — G8929 Other chronic pain: Secondary | ICD-10-CM | POA: Diagnosis not present

## 2019-08-03 DIAGNOSIS — R293 Abnormal posture: Secondary | ICD-10-CM | POA: Diagnosis not present

## 2019-08-03 DIAGNOSIS — R262 Difficulty in walking, not elsewhere classified: Secondary | ICD-10-CM | POA: Diagnosis not present

## 2019-08-03 DIAGNOSIS — R2681 Unsteadiness on feet: Secondary | ICD-10-CM | POA: Diagnosis not present

## 2019-08-03 MED ORDER — METHYLPREDNISOLONE 4 MG PO TBPK: ORAL_TABLET | ORAL | 0 refills | Status: DC

## 2019-08-03 NOTE — Progress Notes (Signed)
Office Visit Note   Patient: Nathan Delgado           Date of Birth: October 30, 1941           MRN: [id number] Visit Date: 08/03/2019              Requested by: Leanna Battles, MD [address],  [place] [address] PCP: Leanna Battles, MD   Assessment & Plan: Visit Diagnoses:  1. 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  Leg pain does not appear to be claudication.  We will try a Medrol Dosepak and have him return in 2 weeks if no improvement.  Office visit over 30 minutes 50% of the time in counseling  Follow-Up Instructions: No follow-ups on file.   Orders:  Orders Placed This Encounter  Procedures  . XR Lumbar Spine 2-3 Views   No orders of the defined types were placed in this encounter.     Procedures: No procedures performed   Clinical Data: No additional findings.   Subjective: Chief Complaint  Patient presents with  . Lower Back - Pain  Patient presents today for recurrent lower back pain. He has been doing therapy for balance, but last Thursday he had increased lower back pain. He had a session of therapy and had improvement, until this past weekend. He is taking tramadol, but that does not help. He has pain in both legs. He had x-rays at his last visit with Dr.Deloria Brassfield in April of 2019, along with an MRI scan that same month.  He had a T-Spine and C-Spine MRI in August of last year. He had a DAT scan in September of last year and was told he had beginning stages Parkinsons, but his PCP does not feel that is definite. He is being treated for symptoms of Parkinson's.  Has been treated through the neurology service for a low dopamine serum level but does not have symptoms of Parkinson's.  Does use a dopamine patch.Marland Kitchen MRI scan of the lumbar spine was performed in April 2019 demonstrating degenerative changes throughout the lumbar spine without  compressive lesions.  He presently is taking Tylenol it seems to make a difference.  He tried tramadol but felt that it was not making much of a difference.  He does have some leg pain but is not sure if that is related to his back.  Has tried gabapentin through the neurologist but with significant side effects No history of diabetes. Does do physical therapy for balance.  Has a personal trainer come to his house several times a week as he cannot go to the gym because of [place]. HPI  Review of Systems  Constitutional: Negative for fatigue.  HENT: Negative for ear pain.   Eyes: Negative for pain.  Respiratory: Negative for shortness of breath.   Cardiovascular: Negative for leg swelling.  Gastrointestinal: Negative for constipation and diarrhea.  Endocrine: Negative for cold intolerance and heat intolerance.  Genitourinary: Negative for difficulty urinating.  Musculoskeletal: Negative for joint swelling.  Skin: Negative for rash.  Allergic/Immunologic: Negative for food allergies.  Neurological: Positive for weakness.  Hematological: Does not bruise/bleed easily.  Psychiatric/Behavioral: Positive for sleep disturbance.     Objective: Vital Signs: BP 116/63   Pulse 60   Ht 5\' 10"  (1.778 m)   Wt 230 lb (104.3 kg)   BMI 33.00 kg/m   Physical Exam Constitutional:  Appearance: He is well-developed.  Eyes:     Pupils: Pupils are equal, round, and reactive to light.  Pulmonary:     Effort: Pulmonary effort is normal.  Skin:    General: Skin is warm and dry.  Neurological:     Mental Status: He is alert and oriented to person, place, and time.  Psychiatric:        Behavior: Behavior normal.     Ortho Exam hard of hearing.  Comfortable sitting.  Does walk with a very short based gait.  Hamstrings were tight.  Straight leg raise negative for back pain.  Reflexes were symmetrical in both lower extremities.  Motor exam intact.  Good sensibility to both feet.  Mild nonpitting  ankle edema.  Some mild percussible tenderness along the lower lumbar spine. Specialty Comments:  No specialty comments available.  Imaging: No results found.   PMFS History: Patient Active Problem List   Diagnosis Date Noted  . OSA on CPAP 04/15/2019  . Myalgia 04/15/2019  . PD (Parkinson's disease) ([place]) 08/19/2018  . Parkinsonism ([place]) 06/29/2018  . 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 S/P laparoscopic appendectomy 10/20/2016  . 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 Fibromyalgia 04/23/2015   Past Medical History:  Diagnosis Date  . Allergic rhinitis   . Anxiety   . Apnea   . Bilateral leg edema   . Cyst of right kidney    small exophytic cyst, incidental finding along with appendicitis on CT scan  . Difficult intubation 10/20/2106  . ED (erectile dysfunction)   . Fibromyalgia   . GERD (gastroesophageal reflux disease)   . Hayfever   . Hypertension   . Lateral epicondylitis   . Paronychia   . 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 Cancer Mother   . Dementia Father   . Depression Father   . Macular degeneration Father   . Neuropathy Neg Hx     Past Surgical History:  Procedure Laterality Date  . CATARACT EXTRACTION, BILATERAL    . COLONOSCOPY  01/2014   normal  . INGUINAL HERNIA REPAIR    . KNEE ARTHROSCOPY    . LAPAROSCOPIC APPENDECTOMY N/A 10/20/2016   Procedure: APPENDECTOMY LAPAROSCOPIC;  Surgeon: Greer Pickerel, MD;  Location: [facility];  Service: General;  Laterality: N/A;  . TONSILLECTOMY     Social History   Occupational History  . Occupation: Retired   Tobacco Use  . Smoking status: Former Smoker    Packs/day: 1.00    Years: 15.00    Pack years: 15.00    Types: Cigarettes    Quit date: 1997    Years since quitting: 23.7  . Smokeless tobacco: Never Used  Substance and Sexual Activity  . Alcohol use: Yes    Alcohol/week: 14.0 standard drinks    Types: 7 Glasses of wine, 7 Shots of liquor per week     Comment: 1-2 drinks per day either 2 wines or scotch and water before dinner and then wine with dinner   . Drug use: No  . Sexual activity: Not Currently    Birth control/protection: Abstinence

## 2019-08-03 NOTE — Addendum Note (Signed)
Addended by: Lendon Collar on: 08/03/2019 11:49 AM   Modules accepted: Orders

## 2019-08-06 DIAGNOSIS — R2689 Other abnormalities of gait and mobility: Secondary | ICD-10-CM | POA: Diagnosis not present

## 2019-08-06 DIAGNOSIS — R2681 Unsteadiness on feet: Secondary | ICD-10-CM | POA: Diagnosis not present

## 2019-08-06 DIAGNOSIS — R293 Abnormal posture: Secondary | ICD-10-CM | POA: Diagnosis not present

## 2019-08-06 DIAGNOSIS — R262 Difficulty in walking, not elsewhere classified: Secondary | ICD-10-CM | POA: Diagnosis not present

## 2019-08-10 DIAGNOSIS — R262 Difficulty in walking, not elsewhere classified: Secondary | ICD-10-CM | POA: Diagnosis not present

## 2019-08-10 DIAGNOSIS — R2689 Other abnormalities of gait and mobility: Secondary | ICD-10-CM | POA: Diagnosis not present

## 2019-08-10 DIAGNOSIS — R293 Abnormal posture: Secondary | ICD-10-CM | POA: Diagnosis not present

## 2019-08-10 DIAGNOSIS — R2681 Unsteadiness on feet: Secondary | ICD-10-CM | POA: Diagnosis not present

## 2019-08-12 DIAGNOSIS — R2681 Unsteadiness on feet: Secondary | ICD-10-CM | POA: Diagnosis not present

## 2019-08-12 DIAGNOSIS — R262 Difficulty in walking, not elsewhere classified: Secondary | ICD-10-CM | POA: Diagnosis not present

## 2019-08-12 DIAGNOSIS — R2689 Other abnormalities of gait and mobility: Secondary | ICD-10-CM | POA: Diagnosis not present

## 2019-08-12 DIAGNOSIS — R293 Abnormal posture: Secondary | ICD-10-CM | POA: Diagnosis not present

## 2019-08-17 DIAGNOSIS — R2689 Other abnormalities of gait and mobility: Secondary | ICD-10-CM | POA: Diagnosis not present

## 2019-08-17 DIAGNOSIS — R262 Difficulty in walking, not elsewhere classified: Secondary | ICD-10-CM | POA: Diagnosis not present

## 2019-08-17 DIAGNOSIS — R2681 Unsteadiness on feet: Secondary | ICD-10-CM | POA: Diagnosis not present

## 2019-08-17 DIAGNOSIS — R293 Abnormal posture: Secondary | ICD-10-CM | POA: Diagnosis not present

## 2019-08-19 DIAGNOSIS — R2681 Unsteadiness on feet: Secondary | ICD-10-CM | POA: Diagnosis not present

## 2019-08-19 DIAGNOSIS — R262 Difficulty in walking, not elsewhere classified: Secondary | ICD-10-CM | POA: Diagnosis not present

## 2019-08-19 DIAGNOSIS — R2689 Other abnormalities of gait and mobility: Secondary | ICD-10-CM | POA: Diagnosis not present

## 2019-08-19 DIAGNOSIS — R293 Abnormal posture: Secondary | ICD-10-CM | POA: Diagnosis not present

## 2019-08-24 DIAGNOSIS — R2681 Unsteadiness on feet: Secondary | ICD-10-CM | POA: Diagnosis not present

## 2019-08-24 DIAGNOSIS — R293 Abnormal posture: Secondary | ICD-10-CM | POA: Diagnosis not present

## 2019-08-24 DIAGNOSIS — R262 Difficulty in walking, not elsewhere classified: Secondary | ICD-10-CM | POA: Diagnosis not present

## 2019-08-24 DIAGNOSIS — R2689 Other abnormalities of gait and mobility: Secondary | ICD-10-CM | POA: Diagnosis not present

## 2019-08-26 DIAGNOSIS — R262 Difficulty in walking, not elsewhere classified: Secondary | ICD-10-CM | POA: Diagnosis not present

## 2019-08-26 DIAGNOSIS — R2681 Unsteadiness on feet: Secondary | ICD-10-CM | POA: Diagnosis not present

## 2019-08-26 DIAGNOSIS — Z23 Encounter for immunization: Secondary | ICD-10-CM | POA: Diagnosis not present

## 2019-08-26 DIAGNOSIS — R2689 Other abnormalities of gait and mobility: Secondary | ICD-10-CM | POA: Diagnosis not present

## 2019-08-26 DIAGNOSIS — R293 Abnormal posture: Secondary | ICD-10-CM | POA: Diagnosis not present

## 2019-08-30 DIAGNOSIS — G589 Mononeuropathy, unspecified: Secondary | ICD-10-CM | POA: Diagnosis not present

## 2019-08-30 DIAGNOSIS — R0982 Postnasal drip: Secondary | ICD-10-CM | POA: Diagnosis not present

## 2019-08-30 DIAGNOSIS — R2681 Unsteadiness on feet: Secondary | ICD-10-CM | POA: Diagnosis not present

## 2019-08-30 DIAGNOSIS — R6889 Other general symptoms and signs: Secondary | ICD-10-CM | POA: Diagnosis not present

## 2019-08-30 DIAGNOSIS — K219 Gastro-esophageal reflux disease without esophagitis: Secondary | ICD-10-CM | POA: Diagnosis not present

## 2019-08-30 DIAGNOSIS — G4733 Obstructive sleep apnea (adult) (pediatric): Secondary | ICD-10-CM | POA: Diagnosis not present

## 2019-08-30 DIAGNOSIS — G20 Parkinson's disease: Secondary | ICD-10-CM | POA: Diagnosis not present

## 2019-08-31 DIAGNOSIS — R262 Difficulty in walking, not elsewhere classified: Secondary | ICD-10-CM | POA: Diagnosis not present

## 2019-08-31 DIAGNOSIS — R2681 Unsteadiness on feet: Secondary | ICD-10-CM | POA: Diagnosis not present

## 2019-08-31 DIAGNOSIS — R2689 Other abnormalities of gait and mobility: Secondary | ICD-10-CM | POA: Diagnosis not present

## 2019-08-31 DIAGNOSIS — R293 Abnormal posture: Secondary | ICD-10-CM | POA: Diagnosis not present

## 2019-09-02 DIAGNOSIS — R2689 Other abnormalities of gait and mobility: Secondary | ICD-10-CM | POA: Diagnosis not present

## 2019-09-02 DIAGNOSIS — R2681 Unsteadiness on feet: Secondary | ICD-10-CM | POA: Diagnosis not present

## 2019-09-02 DIAGNOSIS — R293 Abnormal posture: Secondary | ICD-10-CM | POA: Diagnosis not present

## 2019-09-02 DIAGNOSIS — R262 Difficulty in walking, not elsewhere classified: Secondary | ICD-10-CM | POA: Diagnosis not present

## 2019-09-05 ENCOUNTER — Other Ambulatory Visit: Payer: Self-pay | Admitting: Neurology

## 2019-09-06 ENCOUNTER — Other Ambulatory Visit: Payer: Self-pay

## 2019-09-06 MED ORDER — NEUPRO 4 MG/24HR TD PT24: 1.0000 | MEDICATED_PATCH | Freq: Every day | TRANSDERMAL | 3 refills | Status: DC

## 2019-09-07 DIAGNOSIS — R293 Abnormal posture: Secondary | ICD-10-CM | POA: Diagnosis not present

## 2019-09-07 DIAGNOSIS — R2689 Other abnormalities of gait and mobility: Secondary | ICD-10-CM | POA: Diagnosis not present

## 2019-09-07 DIAGNOSIS — R2681 Unsteadiness on feet: Secondary | ICD-10-CM | POA: Diagnosis not present

## 2019-09-07 DIAGNOSIS — R262 Difficulty in walking, not elsewhere classified: Secondary | ICD-10-CM | POA: Diagnosis not present

## 2019-09-09 DIAGNOSIS — R262 Difficulty in walking, not elsewhere classified: Secondary | ICD-10-CM | POA: Diagnosis not present

## 2019-09-09 DIAGNOSIS — R2689 Other abnormalities of gait and mobility: Secondary | ICD-10-CM | POA: Diagnosis not present

## 2019-09-09 DIAGNOSIS — R293 Abnormal posture: Secondary | ICD-10-CM | POA: Diagnosis not present

## 2019-09-09 DIAGNOSIS — R2681 Unsteadiness on feet: Secondary | ICD-10-CM | POA: Diagnosis not present

## 2019-09-16 DIAGNOSIS — D225 Melanocytic nevi of trunk: Secondary | ICD-10-CM | POA: Diagnosis not present

## 2019-09-16 DIAGNOSIS — L821 Other seborrheic keratosis: Secondary | ICD-10-CM | POA: Diagnosis not present

## 2019-09-16 DIAGNOSIS — D1801 Hemangioma of skin and subcutaneous tissue: Secondary | ICD-10-CM | POA: Diagnosis not present

## 2019-09-16 DIAGNOSIS — L738 Other specified follicular disorders: Secondary | ICD-10-CM | POA: Diagnosis not present

## 2019-09-16 DIAGNOSIS — L570 Actinic keratosis: Secondary | ICD-10-CM | POA: Diagnosis not present

## 2019-09-16 DIAGNOSIS — L814 Other melanin hyperpigmentation: Secondary | ICD-10-CM | POA: Diagnosis not present

## 2019-09-28 ENCOUNTER — Telehealth: Payer: Self-pay | Admitting: Orthopaedic Surgery

## 2019-09-28 NOTE — Telephone Encounter (Signed)
Patient left message wanting to ask Dr.Whitfield if he thought having a back injection would help with his leg pain. Patient's call back # [phone]

## 2019-09-30 NOTE — Telephone Encounter (Signed)
Please call patient and schedule with Dr.Whitfield for next Tuesday afternoon. Thanks!

## 2019-09-30 NOTE — Telephone Encounter (Signed)
Please advise 

## 2019-09-30 NOTE — Telephone Encounter (Signed)
LEFT MESSAGE ON PHONE.  PROBABLY CAN WAIL TILL DR [facility] IS BACK

## 2019-10-05 ENCOUNTER — Ambulatory Visit (INDEPENDENT_AMBULATORY_CARE_PROVIDER_SITE_OTHER): Payer: Medicare Other | Admitting: Orthopaedic Surgery

## 2019-10-05 ENCOUNTER — Other Ambulatory Visit: Payer: Self-pay

## 2019-10-05 ENCOUNTER — Encounter: Payer: Self-pay | Admitting: Orthopaedic Surgery

## 2019-10-05 VITALS — BP 122/62 | HR 85 | Ht 70.0 in | Wt 230.0 lb

## 2019-10-05 DIAGNOSIS — M5442 Lumbago with sciatica, left side: Secondary | ICD-10-CM

## 2019-10-05 DIAGNOSIS — G8929 Other chronic pain: Secondary | ICD-10-CM

## 2019-10-05 DIAGNOSIS — M5441 Lumbago with sciatica, right side: Secondary | ICD-10-CM

## 2019-10-05 MED ORDER — TRAMADOL HCL 50 MG PO TABS: ORAL_TABLET | ORAL | 0 refills | Status: DC

## 2019-10-05 NOTE — Addendum Note (Signed)
Addended by: Lendon Collar on: 10/05/2019 03:45 PM   Modules accepted: Orders

## 2019-10-05 NOTE — Progress Notes (Addendum)
Office Visit Note   Patient: Nathan Delgado           Date of Birth: [birth year]           MRN: [id number] Visit Date: 10/05/2019              Requested by: Leanna Battles, MD [address],  [place] [address] PCP: Leanna Battles, MD   Assessment & Plan: Visit Diagnoses:  1. Chronic bilateral low back pain with bilateral sciatica     Plan: [name] has had history of chronic low back pain with bilateral lower extremity discomfort-possibly radiculopathy.  There is a questionable history of Parkinson's but also a possibility of lumbar spine etiology.  He has had x-rays consistent with degenerative arthritis.  I am concerned that he may have stenosis.  Will order an MRI scan.  Tramadol for pain.  Has had in the past without any problem  Follow-Up Instructions: Return after MRI L-S spine.   Orders:  No orders of the defined types were placed in this encounter.  No orders of the defined types were placed in this encounter.     Procedures: No procedures performed   Clinical Data: No additional findings.   Subjective: Chief Complaint  Patient presents with  . Lower Back - Pain  Patient presents today with bilateral leg pain. He had a DATSCAN. He was told there was definite sign of Parkinsons. He was given gabapentin and requip, but stopped that because he almost fell into his car. He goes to therapy and states that he rubgs CBD oil on his legs, which seems to help. He has been told that his leg problem may be coming from his back. He wants other options for treatment.  Has had prior films of his lumbar spine consistent with degenerative arthritis.  Having considerable pain in both lower extremities which may or may not be referred from his lumbar spine.  There is a questionable history of Parkinson's disease.  He tried several medicines through his neurologist all of which cause significant side effects.  He has had Requip and gabapentin.  There is some concern that that  the problem with his legs may be related to his back.  He attends physical therapy several times a week and has been noted to have tight hamstrings.  He does have an awkward gait.  He also relates that he has more trouble the longer he is on his feet or the further he walks  HPI  Review of Systems  Constitutional: Negative for fatigue.  HENT: Negative for ear pain.   Eyes: Negative for pain.  Respiratory: Negative for shortness of breath.   Cardiovascular: Negative for leg swelling.  Gastrointestinal: Negative for constipation and diarrhea.  Endocrine: Negative for cold intolerance and heat intolerance.  Genitourinary: Negative for difficulty urinating.  Musculoskeletal: Negative for joint swelling.  Skin: Negative for rash.  Allergic/Immunologic: Negative for food allergies.  Neurological: Negative for weakness.  Hematological: Does not bruise/bleed easily.  Psychiatric/Behavioral: Positive for sleep disturbance.     Objective: Vital Signs: BP 122/62   Pulse 85   Ht 5\' 10"  (1.778 m)   Wt 230 lb (104.3 kg)   BMI 33.00 kg/m   Physical Exam Constitutional:      Appearance: He is well-developed.  Eyes:     Pupils: Pupils are equal, round, and reactive to light.  Pulmonary:     Effort: Pulmonary effort is normal.  Skin:    General: Skin is warm and  dry.  Neurological:     Mental Status: He is alert and oriented to person, place, and time.  Psychiatric:        Behavior: Behavior normal.     Ortho Exam awake alert and oriented x3.  Comfortable sitting.  Does have old short based gait does seem a little halting.  Reflexes were normal and symmetrical.  Painless range of motion both hips.  I did think his hamstrings were tight.  No distal edema today.  Good pulses.  Motor exam appears to be intact.  No percussible tenderness of his lumbar spine. Specialty Comments:  No specialty comments available.  Imaging: No results found.   PMFS History: Patient Active Problem List    Diagnosis Date Noted  . Low back pain 08/03/2019  . OSA on CPAP 04/15/2019  . Myalgia 04/15/2019  . PD (Parkinson's disease) ([place]) 08/19/2018  . Parkinsonism ([place]) 06/29/2018  . 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 S/P laparoscopic appendectomy 10/20/2016  . 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 Fibromyalgia 04/23/2015   Past Medical History:  Diagnosis Date  . Allergic rhinitis   . Anxiety   . Apnea   . Bilateral leg edema   . Cyst of right kidney    small exophytic cyst, incidental finding along with appendicitis on CT scan  . Difficult intubation 10/20/2106  . ED (erectile dysfunction)   . Fibromyalgia   . GERD (gastroesophageal reflux disease)   . Hayfever   . Hypertension   . Lateral epicondylitis   . Paronychia   . 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 Cancer Mother   . Dementia Father   . Depression Father   . Macular degeneration Father   . Neuropathy Neg Hx     Past Surgical History:  Procedure Laterality Date  . CATARACT EXTRACTION, BILATERAL    . COLONOSCOPY  01/2014   normal  . INGUINAL HERNIA REPAIR    . KNEE ARTHROSCOPY    . LAPAROSCOPIC APPENDECTOMY N/A 10/20/2016   Procedure: APPENDECTOMY LAPAROSCOPIC;  Surgeon: Greer Pickerel, MD;  Location: [facility];  Service: General;  Laterality: N/A;  . TONSILLECTOMY     Social History   Occupational History  . Occupation: Retired   Tobacco Use  . Smoking status: Former Smoker    Packs/day: 1.00    Years: 15.00    Pack years: 15.00    Types: Cigarettes    Quit date: 1997    Years since quitting: 23.8  . Smokeless tobacco: Never Used  Substance and Sexual Activity  . Alcohol use: Yes    Alcohol/week: 14.0 standard drinks    Types: 7 Glasses of wine, 7 Shots of liquor per week    Comment: 1-2 drinks per day either 2 wines or scotch and water before dinner and then wine with dinner   . Drug use: No  . Sexual activity: Not Currently    Birth  control/protection: Abstinence

## 2019-10-08 DIAGNOSIS — G609 Hereditary and idiopathic neuropathy, unspecified: Secondary | ICD-10-CM | POA: Diagnosis not present

## 2019-10-08 DIAGNOSIS — R2681 Unsteadiness on feet: Secondary | ICD-10-CM | POA: Diagnosis not present

## 2019-10-08 DIAGNOSIS — G20 Parkinson's disease: Secondary | ICD-10-CM | POA: Diagnosis not present

## 2019-10-08 DIAGNOSIS — M545 Low back pain: Secondary | ICD-10-CM | POA: Diagnosis not present

## 2019-10-08 DIAGNOSIS — E785 Hyperlipidemia, unspecified: Secondary | ICD-10-CM | POA: Diagnosis not present

## 2019-10-18 DIAGNOSIS — R42 Dizziness and giddiness: Secondary | ICD-10-CM | POA: Diagnosis not present

## 2019-10-18 DIAGNOSIS — K219 Gastro-esophageal reflux disease without esophagitis: Secondary | ICD-10-CM | POA: Diagnosis not present

## 2019-10-23 ENCOUNTER — Other Ambulatory Visit: Payer: Self-pay

## 2019-10-23 ENCOUNTER — Ambulatory Visit
Admission: RE | Admit: 2019-10-23 | Discharge: 2019-10-23 | Disposition: A | Discharge status: Home / Self Care | Payer: Medicare Other | Source: Ambulatory Visit | Attending: Orthopaedic Surgery | Admitting: Orthopaedic Surgery

## 2019-10-23 DIAGNOSIS — M5442 Lumbago with sciatica, left side: Secondary | ICD-10-CM

## 2019-10-23 DIAGNOSIS — M48061 Spinal stenosis, lumbar region without neurogenic claudication: Secondary | ICD-10-CM | POA: Diagnosis not present

## 2019-10-23 DIAGNOSIS — G8929 Other chronic pain: Secondary | ICD-10-CM

## 2019-10-29 ENCOUNTER — Encounter: Payer: Self-pay | Admitting: Orthopaedic Surgery

## 2019-11-01 ENCOUNTER — Other Ambulatory Visit: Payer: Self-pay | Admitting: Orthopaedic Surgery

## 2019-11-01 DIAGNOSIS — M5441 Lumbago with sciatica, right side: Secondary | ICD-10-CM

## 2019-11-01 DIAGNOSIS — G8929 Other chronic pain: Secondary | ICD-10-CM

## 2019-11-01 NOTE — Telephone Encounter (Signed)
Please schedule ESI

## 2019-11-03 ENCOUNTER — Telehealth: Payer: Self-pay | Admitting: Orthopaedic Surgery

## 2019-11-03 ENCOUNTER — Other Ambulatory Visit: Payer: Self-pay

## 2019-11-03 ENCOUNTER — Ambulatory Visit (INDEPENDENT_AMBULATORY_CARE_PROVIDER_SITE_OTHER): Payer: Medicare Other | Admitting: Orthopaedic Surgery

## 2019-11-03 ENCOUNTER — Encounter: Payer: Self-pay | Admitting: Orthopaedic Surgery

## 2019-11-03 VITALS — Ht 70.0 in | Wt 230.0 lb

## 2019-11-03 DIAGNOSIS — G8929 Other chronic pain: Secondary | ICD-10-CM

## 2019-11-03 DIAGNOSIS — M5441 Lumbago with sciatica, right side: Secondary | ICD-10-CM

## 2019-11-03 DIAGNOSIS — M5442 Lumbago with sciatica, left side: Secondary | ICD-10-CM

## 2019-11-03 NOTE — Telephone Encounter (Signed)
Pt called in said he was just here but forgot to ask Dr.Whitfield if he should continue to go to the gym or wait until after this next test?   [phone]

## 2019-11-03 NOTE — Addendum Note (Signed)
Addended by: Lendon Collar on: 11/03/2019 02:51 PM   Modules accepted: Orders

## 2019-11-03 NOTE — Progress Notes (Signed)
Office Visit Note   Patient: Nathan Delgado           Date of Birth: 1940/12/23           MRN: [id number] Visit Date: 11/03/2019              Requested by: Leanna Battles, MD [place],  [place] [address] PCP: Leanna Battles, MD   Assessment & Plan: Visit Diagnoses:  1. 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  No spinal canal stenosis and not much change from the study performed in April 2019.  Nathan Delgado continues to have a very stiff and short based gait.  He has been diagnosed with Parkinson's "symptoms" and is taking dopamine.  He has been having progressive problems with his legs for 4 to 5 years but seems to be worse in the last year.  He does go to therapy several times a week for his "legs".  He does have an appointment with a neurologist within the next week or 2 for further evaluation.  There was a concern as to whether or not his leg pain may be referable to his low back but based on the MRI scan I am not sure I can ascribe the issues with his leg to the back.  I think it is worth obtaining EMGs and nerve conduction studies he certainly could have Parkinson's but also a peripheral neuropathy.  He is not diabetic.  I wonder if some of the issues with his back is related to the way he is walking.  Follow-Up Instructions: Return After EMGs and nerve conduction studies.   Orders:  Orders Placed This Encounter  Procedures  . Ambulatory referral to Physical Medicine Rehab   No orders of the defined types were placed in this encounter.     Procedures: No procedures performed   Clinical Data: No additional findings.   Subjective: Chief Complaint  Patient presents with  . Lower Back - Pain  Patient presents today with lower back pain. He is wanting cortisone injections in his back. ESI injection was ordered for [facility] on 11/01/2019. He  was given Tramadol for pain, but does not like how it makes him feel. He said that both legs are painful and he walks with his knees bent due to pain in his back. Nathan Delgado has a history of fibromyalgia and Parkinson's symptoms.  He has been evaluated by neurologist and is on a transdermal patch for dopamine.  He has an appointment with a new neurologist next week, Dr. Carles Collet .  HPI  Review of Systems   Objective: Vital Signs: Ht 5\' 10"  (1.778 m)   Wt 230 lb (104.3 kg)   BMI 33.00 kg/m   Physical Exam Constitutional:      Appearance: He is well-developed.  Eyes:     Pupils: Pupils are equal, round, and reactive to light.  Pulmonary:     Effort: Pulmonary effort is normal.  Skin:    General: Skin is warm and dry.  Neurological:     Mental Status: He is alert and oriented to person, place, and time.  Psychiatric:        Behavior: Behavior normal.     Ortho Exam awake alert and oriented x3.  Comfortable sitting straight leg raise negative.  Legs are little bit stiff.  No significant calf pain.  No knee pain.  Painless range of motion both hips.  Has a shuffling gait that is short based.  Does have some burning in both of his lower extremities but good pulses.  No percussible tenderness of lumbar spine.  Specialty Comments:  No specialty comments available.  Imaging: No results found.   PMFS History: Patient Active Problem List   Diagnosis Date Noted  . Low back pain 08/03/2019  . OSA on CPAP 04/15/2019  . Myalgia 04/15/2019  . PD (Parkinson's disease) ([place]) 08/19/2018  . Parkinsonism ([place]) 06/29/2018  . 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 S/P laparoscopic appendectomy 10/20/2016  . 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 Fibromyalgia 04/23/2015   Past Medical History:  Diagnosis Date  . Allergic rhinitis   . Anxiety   . Apnea   . Bilateral leg edema   . Cyst of right kidney    small exophytic cyst, incidental finding along with appendicitis on CT scan  . Difficult  intubation 10/20/2106  . ED (erectile dysfunction)   . Fibromyalgia   . GERD (gastroesophageal reflux disease)   . Hayfever   . Hypertension   . Lateral epicondylitis   . Paronychia   . 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 Cancer Mother   . Dementia Father   . Depression Father   . Macular degeneration Father   . Neuropathy Neg Hx     Past Surgical History:  Procedure Laterality Date  . CATARACT EXTRACTION, BILATERAL    . COLONOSCOPY  01/2014   normal  . INGUINAL HERNIA REPAIR    . KNEE ARTHROSCOPY    . LAPAROSCOPIC APPENDECTOMY N/A 10/20/2016   Procedure: APPENDECTOMY LAPAROSCOPIC;  Surgeon: Greer Pickerel, MD;  Location: [facility];  Service: General;  Laterality: N/A;  . TONSILLECTOMY     Social History   Occupational History  . Occupation: Retired   Tobacco Use  . Smoking status: Former Smoker    Packs/day: 1.00    Years: 15.00    Pack years: 15.00    Types: Cigarettes    Quit date: 1997    Years since quitting: 23.9  . Smokeless tobacco: Never Used  Substance and Sexual Activity  . Alcohol use: Yes    Alcohol/week: 14.0 standard drinks    Types: 7 Glasses of wine, 7 Shots of liquor per week    Comment: 1-2 drinks per day either 2 wines or scotch and water before dinner and then wine with dinner   . Drug use: No  . Sexual activity: Not Currently    Birth control/protection: Abstinence

## 2019-11-04 ENCOUNTER — Telehealth: Payer: Self-pay | Admitting: Neurology

## 2019-11-04 NOTE — Telephone Encounter (Signed)
Called and spoke with patient. He is aware that he can continue going to the gym.

## 2019-11-04 NOTE — Telephone Encounter (Signed)
Dr. Jaynee Eagles can you review and advise if and when pt should be scheduled for a EMG/NCV? He is a urgent internal referral for Chronic Low back pain with Sciatica.   Thank you

## 2019-11-07 NOTE — Telephone Encounter (Signed)
Please do not schedule patient. He has an appointment with Wells Guiles Tat next week so it appears he may be transitioning to [facility]. I think a patient's care should be in one neurology office and I have contacted Ortho to place the referral there. I will also let Wells Guiles Tat know. Thank you for catching this!

## 2019-11-08 ENCOUNTER — Encounter: Payer: Self-pay | Admitting: Orthopaedic Surgery

## 2019-11-08 NOTE — Progress Notes (Signed)
Nathan Delgado was seen today in the movement disorders clinic for neurologic consultation at the request of Leanna Battles, MD.  The consultation is for the evaluation of PD.  The records that were made available to me were reviewed, which took 35 minutes of nonface-to-face time.  Patient was first seen by Dr. Jaynee Eagles on Apr 18, 2015 for an unrelated problem.  He followed back up with her in August, 2019 with complaints of trouble ambulating.  He was felt to be parkinsonian.  A myriad of tests were ordered, including labs, MRI of the brain, cervical, thoracic spine, DaTscan.  I personally reviewed his MRI of the brain.  There was at least moderate atrophy.  DaTscan was completed on August 06, 2018 and demonstrated decrease radiotracer uptake in the bilateral striata per radiology (i'm not so sure).  It does appear that the scan was done while on Wellbutrin.  In September, 2019, the patient was started on rotigotine.  He is using flonase on the skin.  He did want to try something else so at one point, he was tried on ropinirole and gabapentin in addition to the rotigotine due to low back pain.  He states that he was so drunk that he nearly fell and he stopped ropinirole and gabapentin.    Patient did see Dr. Rexene Alberts as well in 2017 for sleep apnea.  His apnea-hypopnea index was 12.6.  He was started on CPAP in 2017.  Current movement d/o meds: neupro patch:  4 mg daily  Specific Symptoms:  Tremor: No. Family hx of similar:  No. Voice: no change Sleep: sleeps well x last 2 months (using cbd oil) - faithful with cpap  Vivid Dreams:  No.  Acting out dreams:  No. Wet Pillows: No. Postural symptoms:  No.  Falls?  No. Bradykinesia symptoms: slow movements Loss of smell:  No. Loss of taste:  No. Urinary Incontinence:  No. Difficulty Swallowing:  No. Handwriting, micrographia: No. Trouble with ADL's:  No.  Trouble buttoning clothing: No. Depression:  No. but has some anxiety and states  that this was the reason for the VPA that was recently started (started yesterday) Memory changes:  No. Hallucinations:  No.  visual distortions: No. N/V:  No. Lightheaded:  No.  Syncope: No. Diplopia:  No. Dyskinesia:  No. Prior exposure to reglan/antipsychotics: No.   Separately, the patient has pain in the legs from the knees to the calf mm.  He sees Dr. Durward Fortes for this and an EMG was recently ordered for this.  He is also working with Alfonse Spruce at [facility] [employer].  PREVIOUS MEDICATIONS: Requip and neupro (with flonase)  ALLERGIES:  No Known Allergies  CURRENT MEDICATIONS:  Current Outpatient Medications  Medication Instructions  . acetaminophen (TYLENOL) 325 MG tablet You can take up to 4000 mg of this per day.  Marland Kitchen aspirin 81 MG chewable tablet Oral  . atorvastatin (LIPITOR) 40 mg, Oral, Daily  . buPROPion (WELLBUTRIN SR) 100 MG 12 hr tablet TAKE 1 TABLET BY MOUTH EVERY DAY IN THE MORNING  . divalproex (DEPAKOTE) 125 mg, Oral, Daily  . Fish Oil-Cholecalciferol (OMEGA-3 + VITAMIN D3 PO) Oral  . losartan-hydrochlorothiazide (HYZAAR) 100-12.5 MG tablet No dose, route, or frequency recorded.  Marland Kitchen MAGNESIUM PO Oral  . Multiple Vitamin (MULTIVITAMIN) tablet 1 tablet, Oral, Daily  . omeprazole (PRILOSEC) 20 MG capsule Oral  . PRAVASTATIN SODIUM PO 1 tablet, Oral, Daily  . rotigotine (NEUPRO) 4 MG/24HR 1 patch, Transdermal, Daily  . UNABLE TO FIND Med  Name: Vinegar gummies. 2 or 3 a day. Per patient.     PAST MEDICAL HISTORY:   Past Medical History:  Diagnosis Date  . Allergic rhinitis   . Anxiety   . Apnea   . Bilateral leg edema   . Cyst of right kidney    small exophytic cyst, incidental finding along with appendicitis on CT scan  . Difficult intubation 10/20/2106  . ED (erectile dysfunction)   . Fibromyalgia   . GERD (gastroesophageal reflux disease)   . Hayfever   . Hypertension   . Lateral epicondylitis   . Paronychia   . Purulent appendicitis 10/23/2016    PAST SURGICAL  HISTORY:   Past Surgical History:  Procedure Laterality Date  . CATARACT EXTRACTION, BILATERAL    . COLONOSCOPY  01/2014   normal  . INGUINAL HERNIA REPAIR    . KNEE ARTHROSCOPY    . LAPAROSCOPIC APPENDECTOMY N/A 10/20/2016   Procedure: APPENDECTOMY LAPAROSCOPIC;  Surgeon: Greer Pickerel, MD;  Location: [facility];  Service: General;  Laterality: N/A;  . TONSILLECTOMY      SOCIAL HISTORY:   Social History   Socioeconomic History  . Marital status: Divorced    Spouse name: Not on file  . Number of children: 2  . Years of education: College  . Highest education level: Not on file  Occupational History  . Occupation: Retired   Tobacco Use  . Smoking status: Former Smoker    Packs/day: 1.00    Years: 15.00    Pack years: 15.00    Types: Cigarettes    Quit date: 1997    Years since quitting: 23.9  . Smokeless tobacco: Never Used  Substance and Sexual Activity  . Alcohol use: Yes    Alcohol/week: 14.0 standard drinks    Types: 7 Glasses of wine, 7 Shots of liquor per week    Comment: 1-2 drinks per day either 2 wines or scotch and water before dinner and then wine with dinner   . Drug use: No  . Sexual activity: Not Currently    Birth control/protection: Abstinence  Other Topics Concern  . Not on file  Social History Narrative   Lives at home alone   Caffeine use: 1 cup coffee per day    Left handed   Social Determinants of Health   Financial Resource Strain:   . Difficulty of Paying Living Expenses: Not on file  Food Insecurity:   . Worried About Charity fundraiser in the Last Year: Not on file  . Ran Out of Food in the Last Year: Not on file  Transportation Needs:   . Lack of Transportation (Medical): Not on file  . Lack of Transportation (Non-Medical): Not on file  Physical Activity:   . Days of Exercise per Week: Not on file  . Minutes of Exercise per Session: Not on file  Stress:   . Feeling of Stress : Not on file  Social Connections:   . Frequency of  Communication with Friends and Family: Not on file  . Frequency of Social Gatherings with Friends and Family: Not on file  . Attends Religious Services: Not on file  . Active Member of Clubs or Organizations: Not on file  . Attends Archivist Meetings: Not on file  . Marital Status: Not on file  Intimate Partner Violence:   . Fear of Current or Ex-Partner: Not on file  . Emotionally Abused: Not on file  . Physically Abused: Not on file  . Sexually  Abused: Not on file    FAMILY HISTORY:   Family Status  Relation Name Status  . Mother  Alive  . Father  Deceased at age 2       Dementia   . Neg Hx  (Not Specified)    ROS:  Review of Systems  Constitutional: Negative.   Eyes: Negative.   Respiratory: Negative.   Cardiovascular: Negative.   Gastrointestinal: Negative.   Genitourinary: Positive for urgency.  Musculoskeletal: Positive for back pain and joint pain (R shoulder due to tendinitis).  Skin: Negative.   Neurological: Positive for tingling.  Endo/Heme/Allergies: Negative.     PHYSICAL EXAMINATION:    VITALS:   Vitals:   11/11/19 0834  BP: 139/81  Pulse: 92  Resp: 18  SpO2: 95%  Weight: 236 lb (107 kg)  Height: _0  (1.778 m)    GEN:  The patient appears stated age and is in NAD. HEENT:  Normocephalic, atraumatic.  The mucous membranes are moist. The superficial temporal arteries are without ropiness or tenderness. CV:  RRR Lungs:  CTAB Neck/HEME:  There are no carotid bruits bilaterally.  Neurological examination:  Orientation: The patient is alert and oriented x3. Fund of knowledge is appropriate.  Recent and remote memory are intact.  Attention and concentration are normal.    Able to name objects and repeat phrases. Cranial nerves: There is good facial symmetry. Extraocular muscles are intact. The visual fields are full to confrontational testing. The speech is fluent and clear. Soft palate rises symmetrically and there is no tongue deviation.  Hearing is decreased to conversational tone. Sensation: Sensation is intact to light and pinprick throughout (facial, trunk, extremities).  Pin not decreased in stocking distribution.  Vibration is intact at the bilateral big toe but slightly decreased. There is no extinction with double simultaneous stimulation. There is no sensory dermatomal level identified. Motor: Strength is 5/5 in the bilateral upper and lower extremities.   Shoulder shrug is equal and symmetric.  There is no pronator drift. Deep tendon reflexes: Deep tendon reflexes are 2-/4 at the bilateral biceps, triceps, brachioradialis, 1/4 at the bilateral patella and absent at the bilateral achilles. Plantar responses are downgoing bilaterally.  Movement examination: Tone: There is normal tone in the UE/LE Abnormal movements: none Coordination:  There is no decremation with RAM's, with any form of RAMS, including alternating supination and pronation of the forearm, hand opening and closing, finger taps, heel taps and toe taps. Gait and Station: The patient has mild difficulty arising out of a deep-seated chair without the use of the hands. The patient's stride length is decreased.  He is wide based and very flexed at the knees.  He will shuffle at the very end of his gait as he approaches a chair (the end point of the gait)  Lab Results  Component Value Date   VITAMINB12 1,082 06/25/2018     Chemistry      Component Value Date/Time   NA 143 06/25/2018 1518   K 4.3 06/25/2018 1518   CL 101 06/25/2018 1518   CO2 28 06/25/2018 1518   BUN 16 06/25/2018 1518   CREATININE 0.99 06/25/2018 1518      Component Value Date/Time   CALCIUM 9.8 06/25/2018 1518   ALKPHOS 50 06/25/2018 1518   AST 31 06/25/2018 1518   ALT 41 06/25/2018 1518   BILITOT 0.5 06/25/2018 1518       ASSESSMENT/PLAN:  1.  Parkinsons, by hx  -Explained to the patient that today, the  patient did not meet the [place] brain bank criteria or the modified MDS  criteria.  That being said, he was on the rotigotine patch, which certainly can cover up the symptoms and modify what is seen clinically.  Therefore, we are going to reschedule an appointment in the future off of the rotigotine and see exactly what signs are present.  -Today, while on meds, his symptoms really look more vascular than they did idiopathic Parkinson's, but we will see what he looks like off of medication.  -Regarding his prior DaTscan, this was done on Wellbutrin.  We did discuss that this can cause false positives.  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  -For now, I will leave him on the rotigotine patch, 4 mg.  He is using Flonase with the patch because of skin site reaction.  This is appropriate.  2.  Chronic distal leg and calf pain  -seeing ortho  -they have ordered EMG which is pending and pt to f/u with them after  -pt understands that this is out of my field of expertise  -Patient asked me about what would happen if he needed gabapentin in the future.  He felt like he had a reaction with it.  My suspicion is that he was just started at too high of a dose at once and that if he started with a lower dose and titrated up, then he would probably tolerate it better.  I will certainly leave this to his orthopedic physician that is treating him.  3.  osas  -mild with AHI 12  -on cpap and compliant  4.  Much greater than 50% of this visit was spent in counseling and coordinating care.  Total face to face time:  60 min.  This did not include the 35 min of record review which was detailed above, which was non face to face time.   Cc:  Leanna Battles, MD

## 2019-11-09 ENCOUNTER — Other Ambulatory Visit: Payer: Self-pay

## 2019-11-09 DIAGNOSIS — G8929 Other chronic pain: Secondary | ICD-10-CM

## 2019-11-09 DIAGNOSIS — M5442 Lumbago with sciatica, left side: Secondary | ICD-10-CM

## 2019-11-11 ENCOUNTER — Encounter: Payer: Self-pay | Admitting: Neurology

## 2019-11-11 ENCOUNTER — Other Ambulatory Visit: Payer: Self-pay

## 2019-11-11 ENCOUNTER — Ambulatory Visit (INDEPENDENT_AMBULATORY_CARE_PROVIDER_SITE_OTHER): Payer: Medicare Other | Admitting: Neurology

## 2019-11-11 VITALS — BP 139/81 | HR 92 | Resp 18 | Ht 70.0 in | Wt 236.0 lb

## 2019-11-11 DIAGNOSIS — M791 Myalgia, unspecified site: Secondary | ICD-10-CM | POA: Diagnosis not present

## 2019-11-11 DIAGNOSIS — G20 Parkinson's disease: Secondary | ICD-10-CM | POA: Diagnosis not present

## 2019-11-11 DIAGNOSIS — G4733 Obstructive sleep apnea (adult) (pediatric): Secondary | ICD-10-CM | POA: Diagnosis not present

## 2019-11-11 DIAGNOSIS — Z9989 Dependence on other enabling machines and devices: Secondary | ICD-10-CM | POA: Diagnosis not present

## 2019-11-11 DIAGNOSIS — G20C Parkinsonism, unspecified: Secondary | ICD-10-CM

## 2019-11-11 NOTE — Patient Instructions (Signed)
Stop your neupro patch 24 hours prior to your next visit with me on 12/06/19

## 2019-11-29 DIAGNOSIS — G4733 Obstructive sleep apnea (adult) (pediatric): Secondary | ICD-10-CM | POA: Diagnosis not present

## 2019-11-29 DIAGNOSIS — M545 Low back pain: Secondary | ICD-10-CM | POA: Diagnosis not present

## 2019-11-29 DIAGNOSIS — R2681 Unsteadiness on feet: Secondary | ICD-10-CM | POA: Diagnosis not present

## 2019-11-29 DIAGNOSIS — I739 Peripheral vascular disease, unspecified: Secondary | ICD-10-CM | POA: Diagnosis not present

## 2019-11-29 DIAGNOSIS — G20 Parkinson's disease: Secondary | ICD-10-CM | POA: Diagnosis not present

## 2019-11-29 DIAGNOSIS — E785 Hyperlipidemia, unspecified: Secondary | ICD-10-CM | POA: Diagnosis not present

## 2019-11-30 ENCOUNTER — Other Ambulatory Visit: Payer: Self-pay

## 2019-11-30 ENCOUNTER — Ambulatory Visit (INDEPENDENT_AMBULATORY_CARE_PROVIDER_SITE_OTHER): Payer: Medicare Other | Admitting: Neurology

## 2019-11-30 DIAGNOSIS — M5441 Lumbago with sciatica, right side: Secondary | ICD-10-CM

## 2019-11-30 DIAGNOSIS — M5442 Lumbago with sciatica, left side: Secondary | ICD-10-CM | POA: Diagnosis not present

## 2019-11-30 DIAGNOSIS — G8929 Other chronic pain: Secondary | ICD-10-CM

## 2019-11-30 NOTE — Procedures (Signed)
[facility]  [address]  [place], [place] [address] Tel: [phone] Fax:  [phone] Test Date:  11/30/2019  Patient: Nathan Delgado DOB: 03-Jan-1941 Physician: Narda Amber, DO  Sex: Male Height: 5\' 10"  Ref Phys: Joni Fears, MD  ID#: [id number] Temp: 32.0C Technician:    Patient Complaints: This is a 79 year old man referred for evaluation of chronic low back pain radiating into the legs.  NCV & EMG Findings: Extensive electrodiagnostic testing of the right lower extremity and additional studies of the left shows:  1. Bilateral sural and superficial peroneal sensory responses are within normal limits. 2. Left peroneal motor response is mildly reduced (2.1 mV) at the extensor digitorum brevis, and normal at the tibialis anterior.  Right peroneal and bilateral tibial motor responses are within normal limits. 3. Bilateral tibial H reflex studies are within normal limits. 4. There is no evidence of active or chronic motor axonal loss changes affecting any of the tested muscles.  Motor unit configuration and recruitment pattern is within normal limits.  Impression: Is a normal study of the lower extremities.  In particular, there is no evidence of a large fiber sensorimotor polyneuropathy or lumbosacral radiculopathy.   ___________________________ Narda Amber, DO    Nerve Conduction Studies Anti Sensory Summary Table   Site NR Peak (ms) Norm Peak (ms) P-T Amp (V) Norm P-T Amp  Left Sup Peroneal Anti Sensory (Ant Lat Mall)  32C  12 cm    2.4 <4.6 7.8 >3  Right Sup Peroneal Anti Sensory (Ant Lat Mall)  32C  12 cm    2.7 <4.6 5.3 >3  Left Sural Anti Sensory (Lat Mall)  32C  Calf    3.5 <4.6 6.0 >3  Right Sural Anti Sensory (Lat Mall)  32C  Calf    2.7 <4.6 7.9 >3   Motor Summary Table   Site NR Onset (ms) Norm Onset (ms) O-P Amp (mV) Norm O-P Amp Site1 Site2 Delta-0 (ms) Dist (cm) Vel (m/s) Norm Vel (m/s)  Left Peroneal Motor (Ext Dig Brev)   32C  Ankle    5.3 <6.0 2.1 >2.5 B Fib Ankle 7.6 39.0 51 >40  B Fib    12.9  1.4  Poplt B Fib 1.6 8.0 50 >40  Poplt    14.5  1.3         Right Peroneal Motor (Ext Dig Brev)  32C  Ankle    4.0 <6.0 3.5 >2.5 B Fib Ankle 7.6 41.0 54 >40  B Fib    11.6  3.3  Poplt B Fib 1.4 9.0 64 >40  Poplt    13.0  3.2         Left Peroneal TA Motor (Tib Ant)  32C  Fib Head    2.4 <4.5 4.4 >3 Poplit Fib Head 1.4 8.0 57 >40  Poplit    3.8  4.3         Left Tibial Motor (Abd Hall Brev)  32C  Ankle    4.5 <6.0 6.7 >4 Knee Ankle 9.3 44.0 47 >40  Knee    13.8  6.1         Right Tibial Motor (Abd Hall Brev)  32C  Ankle    3.3 <6.0 5.1 >4 Knee Ankle 8.7 47.0 54 >40  Knee    12.0  3.0          H Reflex Studies   NR H-Lat (ms) Lat Norm (ms) L-R H-Lat (ms)  Left Tibial (Gastroc)  32C     32.65 <35 1.23  Right Tibial (Gastroc)  32C     33.88 <35 1.23   EMG   Side Muscle Ins Act Fibs Psw Fasc Number Recrt Dur Dur. Amp Amp. Poly Poly. Comment  Right AntTibialis Nml Nml Nml Nml Nml Nml Nml Nml Nml Nml Nml Nml N/A  Right Gastroc Nml Nml Nml Nml Nml Nml Nml Nml Nml Nml Nml Nml N/A  Right Flex Dig Long Nml Nml Nml Nml Nml Nml Nml Nml Nml Nml Nml Nml N/A  Right RectFemoris Nml Nml Nml Nml Nml Nml Nml Nml Nml Nml Nml Nml N/A  Right GluteusMed Nml Nml Nml Nml Nml Nml Nml Nml Nml Nml Nml Nml N/A  Left AntTibialis Nml Nml Nml Nml Nml Nml Nml Nml Nml Nml Nml Nml N/A  Left Gastroc Nml Nml Nml Nml Nml Nml Nml Nml Nml Nml Nml Nml N/A  Left Flex Dig Long Nml Nml Nml Nml Nml Nml Nml Nml Nml Nml Nml Nml N/A  Left RectFemoris Nml Nml Nml Nml Nml Nml Nml Nml Nml Nml Nml Nml N/A  Left GluteusMed Nml Nml Nml Nml Nml Nml Nml Nml Nml Nml Nml Nml N/A      Waveforms:

## 2019-12-01 ENCOUNTER — Ambulatory Visit (HOSPITAL_COMMUNITY)
Admission: RE | Admit: 2019-12-01 | Discharge: 2019-12-01 | Disposition: A | Discharge status: Home / Self Care | Payer: Medicare Other | Source: Ambulatory Visit | Attending: Family | Admitting: Family

## 2019-12-01 ENCOUNTER — Other Ambulatory Visit (HOSPITAL_COMMUNITY): Payer: Self-pay | Admitting: Internal Medicine

## 2019-12-01 ENCOUNTER — Other Ambulatory Visit: Payer: Self-pay

## 2019-12-01 DIAGNOSIS — I739 Peripheral vascular disease, unspecified: Secondary | ICD-10-CM

## 2019-12-02 NOTE — Progress Notes (Signed)
Nathan Delgado was seen today in follow up for possible parkinsons.  I asked to see him today off of his rotigotine patch, as he did not look particularly parkinsonian last visit on the patch.  He took off the patch off 2 days ago and he notes no difference being off of it. My previous records were reviewed prior to todays visit. Pt denies falls.  Pt denies lightheadedness, near syncope.  No hallucinations.  Mood has been good.  States that his calves continue to hurt all of the time.  He asks his PCP if he could stop the statin and he has been off of it since wed.  Still feels the same.    Current prescribed movement disorder medications: Holding neupro and doesn't wish to go back on   I reviewed the patient's MRI of the brain from August, 2019.  There may be some ventricular hypertrophy, out of proportion to atrophy   PREVIOUS MEDICATIONS: neupro; requip  ALLERGIES:  No Known Allergies  CURRENT MEDICATIONS:  Outpatient Encounter Medications as of 12/06/2019  Medication Sig  . acetaminophen (TYLENOL) 325 MG tablet You can take up to 4000 mg of this per day. (Patient taking differently: as needed. You can take up to 4000 mg of this per day.)  . aspirin 81 MG chewable tablet Chew by mouth.  . divalproex (DEPAKOTE) 125 MG DR tablet Take 125 mg by mouth daily.  . Fish Oil-Cholecalciferol (OMEGA-3 + VITAMIN D3 PO) Take by mouth.  . losartan-hydrochlorothiazide (HYZAAR) 100-12.5 MG tablet   . MAGNESIUM PO Take by mouth.  . Multiple Vitamin (MULTIVITAMIN) tablet Take 1 tablet by mouth daily.  . NON FORMULARY CBD OIL CREAM BILATERAL LEGS DAILY  . NON FORMULARY Take 1 drop by mouth daily. CBD oil  . nortriptyline (PAMELOR) 25 MG capsule Take 25 mg by mouth at bedtime.  Marland Kitchen omeprazole (PRILOSEC) 20 MG capsule Take by mouth.  . rotigotine (NEUPRO) 4 MG/24HR Place 1 patch onto the skin daily.  Marland Kitchen UNABLE TO FIND Med Name: Vinegar gummies. 2 or 3 a day. Per patient.  Marland Kitchen atorvastatin (LIPITOR) 40 MG  tablet Take 40 mg by mouth daily.  Marland Kitchen buPROPion (WELLBUTRIN SR) 100 MG 12 hr tablet TAKE 1 TABLET BY MOUTH EVERY DAY IN THE MORNING  . carbidopa-levodopa (SINEMET IR) 25-100 MG tablet Take 1 tablet by mouth 3 (three) times daily.  Marland Kitchen PRAVASTATIN SODIUM PO Take 1 tablet by mouth daily.   No facility-administered encounter medications on file as of 12/06/2019.    PHYSICAL EXAMINATION:    VITALS:   Vitals:   12/06/19 1106  BP: (!) 144/78  Pulse: (!) 102  Temp: 98.7 F (37.1 C)  SpO2: 95%  Weight: 234 lb 9.6 oz (106.4 kg)  Height: 5' 9.5" (1.765 m)    GEN:  The patient appears stated age and is in NAD. HEENT:  Normocephalic, atraumatic.  The mucous membranes are moist. The superficial temporal arteries are without ropiness or tenderness. CV:  RRR Lungs:  CTAB Neck/HEME:  There are no carotid bruits bilaterally.  Neurological examination:  Orientation: The patient is alert and oriented x3. Cranial nerves: There is good facial symmetry with no facial hypomimia. The speech is fluent and clear. Soft palate rises symmetrically and there is no tongue deviation. Hearing is intact to conversational tone. Sensation: Sensation is intact to light touch throughout Motor: Strength is at least antigravity x4.  Movement examination: Tone: There is normal tone in the upper and lower extremities  Abnormal movements: None Coordination:  There is no decremation with RAM's, with any form of RAMS, including alternating supination and pronation of the forearm, hand opening and closing, finger taps, heel taps and toe taps. Gait and Station: The patient has no difficulty arising out of a deep-seated chair without the use of the hands. The patient's stride length is short stepped.  He walks with flexed knees.  The longer he walks, though shorter steps he takes it ultimately does shuffle somewhat.  He does freeze in the doorway and once he gets close to his chair.     ASSESSMENT/PLAN:  1.    Parkinsonism              -Patient does not meet the [place] brain bank criteria or the modified MDS criteria for the diagnosis of Parkinson's disease.  He does look parkinsonian when he walks.  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  MRI does not confirm vascular parkinsonism.  MRI was done about a year and a half ago.  At this point in time, my recommendation would be to go ahead and try levodopa and see if this would help his walking or cramping at all.  We may need to push the dose higher.  If not, then we can go ahead and consider a repeat MRI or CT to see what the ventricles look like and consider if an LP would be of any value.  -He was given a titration schedule to work up to carbidopa/levodopa 25/100, 1 tablet 3 times per day.  Discussed risk, benefits, and side effects.             -Regarding his prior DaTscan, this was done on Wellbutrin.  We did discuss that this can cause false positives.             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             -We will leave him off the rotigotine patch.  He did not find it helpful.  It was very expensive.  -Discussed limiting alcohol.  -safety discussed  2.  Chronic distal leg and calf pain             -seeing ortho and defer to their recommendations             -they have ordered EMG which was normal             -pt understands that this is out of my field of expertise             -Patient asked me about what would happen if he needed gabapentin in the future.  He felt like he had a reaction with it.  My suspicion is that he was just started at too high of a dose at once and that if he started with a lower dose and titrated up, then he would probably tolerate it better.  I will certainly leave this to his orthopedic physician that is treating him.  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  Time included that spent on review of records (prior notes available to me/labs/imaging if pertinent),  reviewing prior MRI, discussing treatment and goals, answering patient's questions and coordinating care.  Cc:  Leanna Battles, MD

## 2019-12-03 ENCOUNTER — Telehealth: Payer: Self-pay

## 2019-12-03 NOTE — Telephone Encounter (Signed)
-----   Message from [place], DO sent at 12/02/2019  4:28 PM EST ----- Call patient and remind him that I don't want him to put on his neupro patch Sunday at all, and if he can hold it Saturday as well that would be great.  I will see him on Monday.

## 2019-12-03 NOTE — Telephone Encounter (Signed)
Pt called and reminded not to put on neupro patch  Saturday  and Sunday, pt verbalized understanding, stated he had it wrote down, and that he would see [place] Monday,

## 2019-12-06 ENCOUNTER — Ambulatory Visit (INDEPENDENT_AMBULATORY_CARE_PROVIDER_SITE_OTHER): Payer: Medicare Other | Admitting: Neurology

## 2019-12-06 ENCOUNTER — Other Ambulatory Visit: Payer: Self-pay

## 2019-12-06 ENCOUNTER — Encounter: Payer: Self-pay | Admitting: Neurology

## 2019-12-06 VITALS — BP 144/78 | HR 102 | Temp 98.7°F | Ht 69.5 in | Wt 234.6 lb

## 2019-12-06 DIAGNOSIS — G20 Parkinson's disease: Secondary | ICD-10-CM

## 2019-12-06 MED ORDER — CARBIDOPA-LEVODOPA 25-100 MG PO TABS: 1.0000 | ORAL_TABLET | Freq: Three times a day (TID) | ORAL | 1 refills | Status: DC

## 2019-12-06 NOTE — Patient Instructions (Addendum)
Start Carbidopa Levodopa as follows:  Take 1/2 tablet three times daily, at least 30 minutes before meals (approximately 7am/11am/4pm), for one week  Then take 1/2 tablet in the morning, 1/2 tablet in the afternoon, 1 tablet in the evening, at least 30 minutes before meals, for one week  Then take 1/2 tablet in the morning, 1 tablet in the afternoon, 1 tablet in the evening, at least 30 minutes before meals, for one week  Then take 1 tablet three times daily at 7am/11am/4pm, at least 30 minutes before meals   As a reminder, carbidopa/levodopa can be taken at the same time as a carbohydrate, but we like to have you take your pill either 30 minutes before a protein source or 1 hour after as protein can interfere with carbidopa/levodopa absorption.  The physicians and staff at [facility] are committed to providing excellent care. You may receive a survey requesting feedback about your experience at our office. We strive to receive "very good" responses to the survey questions. If you feel that your experience would prevent you from giving the office a "very good " response, please contact our office to try to remedy the situation. We may be reached at [phone]. Thank you for taking the time out of your busy day to complete the survey.  

## 2019-12-13 DIAGNOSIS — H9313 Tinnitus, bilateral: Secondary | ICD-10-CM | POA: Diagnosis not present

## 2019-12-13 DIAGNOSIS — Z822 Family history of deafness and hearing loss: Secondary | ICD-10-CM | POA: Diagnosis not present

## 2019-12-13 DIAGNOSIS — Z77122 Contact with and (suspected) exposure to noise: Secondary | ICD-10-CM | POA: Diagnosis not present

## 2019-12-13 DIAGNOSIS — H903 Sensorineural hearing loss, bilateral: Secondary | ICD-10-CM | POA: Diagnosis not present

## 2019-12-21 ENCOUNTER — Ambulatory Visit: Payer: Medicare Other

## 2019-12-30 ENCOUNTER — Ambulatory Visit: Payer: Medicare Other | Attending: Internal Medicine

## 2019-12-30 DIAGNOSIS — Z23 Encounter for immunization: Secondary | ICD-10-CM | POA: Insufficient documentation

## 2019-12-30 NOTE — Progress Notes (Signed)
   Covid-19 Vaccination Clinic  Name:  Nathan Delgado    MRN: [id number] DOB: August 07, 1941  12/30/2019  [name] was observed post Covid-19 immunization for 15 minutes without incidence. He was provided with Vaccine Information Sheet and instruction to access the V-Safe system.   [name] was instructed to call 911 with any severe reactions post vaccine: Marland Kitchen Difficulty breathing  . Swelling of your face and throat  . A fast heartbeat  . A bad rash all over your body  . Dizziness and weakness    Immunizations Administered    Name Date Dose VIS Date Route   Pfizer COVID-19 Vaccine 12/30/2019 12:18 PM 0.3 mL 11/05/2019 Intramuscular   Manufacturer: [place]   Lot: [id number]   [place]: [id number]

## 2020-01-04 DIAGNOSIS — H16223 Keratoconjunctivitis sicca, not specified as Sjogren's, bilateral: Secondary | ICD-10-CM | POA: Diagnosis not present

## 2020-01-10 ENCOUNTER — Ambulatory Visit: Payer: Medicare Other | Admitting: Neurology

## 2020-01-24 ENCOUNTER — Ambulatory Visit: Payer: Self-pay

## 2020-01-24 ENCOUNTER — Ambulatory Visit: Payer: Medicare Other | Attending: Internal Medicine

## 2020-01-24 DIAGNOSIS — Z23 Encounter for immunization: Secondary | ICD-10-CM

## 2020-01-24 NOTE — Progress Notes (Signed)
   Covid-19 Vaccination Clinic  Name:  Nathan Delgado    MRN: [id number] DOB: [birth year]  01/24/2020  Mr. Olkowski was observed post Covid-19 immunization for 15 minutes without incidence. He was provided with Vaccine Information Sheet and instruction to access the V-Safe system.   Mr. Grasley was instructed to call 911 with any severe reactions post vaccine: Marland Kitchen Difficulty breathing  . Swelling of your face and throat  . A fast heartbeat  . A bad rash all over your body  . Dizziness and weakness    Immunizations Administered    Name Date Dose VIS Date Route   Pfizer COVID-19 Vaccine 01/24/2020  3:34 PM 0.3 mL 11/05/2019 Intramuscular   Manufacturer: [place]   Lot: [id number]   [place]: [id number]

## 2020-02-22 ENCOUNTER — Ambulatory Visit: Payer: Medicare Other

## 2020-03-01 DIAGNOSIS — K219 Gastro-esophageal reflux disease without esophagitis: Secondary | ICD-10-CM | POA: Diagnosis not present

## 2020-03-03 MED ORDER — CARBIDOPA-LEVODOPA 25-100 MG PO TABS: 1.0000 | ORAL_TABLET | Freq: Three times a day (TID) | ORAL | 1 refills | Status: DC

## 2020-03-20 DIAGNOSIS — Z125 Encounter for screening for malignant neoplasm of prostate: Secondary | ICD-10-CM | POA: Diagnosis not present

## 2020-03-20 DIAGNOSIS — E7849 Other hyperlipidemia: Secondary | ICD-10-CM | POA: Diagnosis not present

## 2020-03-27 DIAGNOSIS — Z1331 Encounter for screening for depression: Secondary | ICD-10-CM | POA: Diagnosis not present

## 2020-03-27 DIAGNOSIS — R2681 Unsteadiness on feet: Secondary | ICD-10-CM | POA: Diagnosis not present

## 2020-03-27 DIAGNOSIS — E785 Hyperlipidemia, unspecified: Secondary | ICD-10-CM | POA: Diagnosis not present

## 2020-03-27 DIAGNOSIS — F419 Anxiety disorder, unspecified: Secondary | ICD-10-CM | POA: Diagnosis not present

## 2020-03-27 DIAGNOSIS — F5104 Psychophysiologic insomnia: Secondary | ICD-10-CM | POA: Diagnosis not present

## 2020-03-27 DIAGNOSIS — M545 Low back pain: Secondary | ICD-10-CM | POA: Diagnosis not present

## 2020-03-27 DIAGNOSIS — K219 Gastro-esophageal reflux disease without esophagitis: Secondary | ICD-10-CM | POA: Diagnosis not present

## 2020-03-27 DIAGNOSIS — Z1339 Encounter for screening examination for other mental health and behavioral disorders: Secondary | ICD-10-CM | POA: Diagnosis not present

## 2020-03-27 DIAGNOSIS — G20 Parkinson's disease: Secondary | ICD-10-CM | POA: Diagnosis not present

## 2020-03-27 DIAGNOSIS — I739 Peripheral vascular disease, unspecified: Secondary | ICD-10-CM | POA: Diagnosis not present

## 2020-03-27 DIAGNOSIS — Z0000 Encounter for general adult medical examination without abnormal findings: Secondary | ICD-10-CM | POA: Diagnosis not present

## 2020-03-27 DIAGNOSIS — I10 Essential (primary) hypertension: Secondary | ICD-10-CM | POA: Diagnosis not present

## 2020-03-27 DIAGNOSIS — R82998 Other abnormal findings in urine: Secondary | ICD-10-CM | POA: Diagnosis not present

## 2020-03-27 DIAGNOSIS — G4733 Obstructive sleep apnea (adult) (pediatric): Secondary | ICD-10-CM | POA: Diagnosis not present

## 2020-03-27 DIAGNOSIS — G609 Hereditary and idiopathic neuropathy, unspecified: Secondary | ICD-10-CM | POA: Diagnosis not present

## 2020-03-31 DIAGNOSIS — Z1212 Encounter for screening for malignant neoplasm of rectum: Secondary | ICD-10-CM | POA: Diagnosis not present

## 2020-05-01 NOTE — Progress Notes (Signed)
Assessment/Plan:   1.  Parkinsonism  -DaTscan done in 2019 demonstrated decreased radiotracer activity in the bilateral striata but was done on Wellbutrin  -Patient has not met full criteria for the diagnosis of Parkinson's disease.  -Reviewed MRI from 2019 again.  Still think that ventricles are out of proportion to degree of atrophy.  He and I discussed this.  Discussed that while diagnosis of NPH is rare and even controversial, I think that we need to rule this out.  He was agreeable to high-volume lumbar puncture with physical therapy.  We will schedule this.  He will hold the levodopa for this.  He will hold his aspirin for 5 days prior to the lumbar puncture.  -May consider levodopa challenge test.  Atypical state still in the differential.  Vascular parkinsonism is as well, although MRI does not confirm that.   Subjective:   Nathan Delgado was seen today in follow up for parkinsonism.  My previous records were reviewed prior to todays visit as well as outside records available to me.  Last visit, the patient did not meet full criteria for diagnosis of Parkinson's disease, but looked parkinsonian when he walked.  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  MRI did not confirm vascular parkinsonism.  Nonetheless, we decided to try levodopa.  He reports that he is generally about the same.  In regards to cramping, he reports that he is not having this but is having leg pain and its related to back pain.  States that Leanna Battles, MD stopped his statin due to leg pain.  Still has the leg pain but just stopped it.  Saw Dr. Durward Fortes and had MRI last November and didn't have significant spinal stenosis.    Pt denies falls.  Pt denies lightheadedness, near syncope.  No hallucinations.  Mood has been good.  Current prescribed movement disorder medications: Carbidopa/levodopa 25/100, 1 tablet 3 times per day   PREVIOUS MEDICATIONS:  neupro; requip; lyrica;  gabapentin  ALLERGIES:  No Known Allergies  CURRENT MEDICATIONS:  Outpatient Encounter Medications as of 05/02/2020  Medication Sig  . acetaminophen (TYLENOL) 325 MG tablet You can take up to 4000 mg of this per day. (Patient taking differently: as needed. You can take up to 4000 mg of this per day.)  . aspirin 81 MG chewable tablet Chew by mouth.  Marland Kitchen buPROPion (WELLBUTRIN SR) 100 MG 12 hr tablet TAKE 1 TABLET BY MOUTH EVERY DAY IN THE MORNING  . carbidopa-levodopa (SINEMET IR) 25-100 MG tablet Take 1 tablet by mouth 3 (three) times daily.  . Fish Oil-Cholecalciferol (OMEGA-3 + VITAMIN D3 PO) Take by mouth.  . losartan-hydrochlorothiazide (HYZAAR) 100-12.5 MG tablet   . MAGNESIUM PO Take by mouth.  . Multiple Vitamin (MULTIVITAMIN) tablet Take 1 tablet by mouth daily.  . NON FORMULARY CBD OIL CREAM BILATERAL LEGS DAILY  . NON FORMULARY Take 1 drop by mouth daily. CBD oil  . omeprazole (PRILOSEC) 20 MG capsule Take by mouth.  . rotigotine (NEUPRO) 4 MG/24HR Place 1 patch onto the skin daily.  Marland Kitchen UNABLE TO FIND Med Name: Vinegar gummies. 2 or 3 a day. Per patient.  Marland Kitchen atorvastatin (LIPITOR) 40 MG tablet Take 40 mg by mouth daily.  . divalproex (DEPAKOTE) 125 MG DR tablet Take 125 mg by mouth daily.  . nortriptyline (PAMELOR) 25 MG capsule Take 25 mg by mouth at bedtime.  Marland Kitchen PRAVASTATIN SODIUM PO Take 1 tablet by mouth daily.   No  facility-administered encounter medications on file as of 05/02/2020.    Objective:   PHYSICAL EXAMINATION:    VITALS:   Vitals:   05/02/20 1453  BP: (!) 146/82  Pulse: 81  Resp: 18  SpO2: 96%  Weight: 234 lb (106.1 kg)  Height: _0  (1.778 m)    GEN:  The patient appears stated age and is in NAD. HEENT:  Normocephalic, atraumatic.  The mucous membranes are moist. The superficial temporal arteries are without ropiness or tenderness. CV:  RRR Lungs:  CTAB Neck/HEME:  There are no carotid bruits bilaterally.  Neurological examination:  Orientation:  The patient is alert and oriented x3. Cranial nerves: There is good facial symmetry with mild facial hypomimia. The speech is fluent and clear. Soft palate rises symmetrically and there is no tongue deviation. Hearing is intact to conversational tone. Sensation: Sensation is intact to light touch throughout Motor: Strength is at least antigravity x4.  Movement examination: Tone: There is normal tone in the upper and lower extremities. Abnormal movements: No Coordination:  There is no decremation with RAM's Gait and Station: The patient has no difficulty arising out of a deep-seated chair without the use of the hands.  He has start hesitation.  Gait is wide-based, short stepped and occasionally shuffling.  He sometimes will freeze and have a few shorter steps again.  I have reviewed and interpreted the following labs independently    Chemistry      Component Value Date/Time   NA 143 06/25/2018 1518   K 4.3 06/25/2018 1518   CL 101 06/25/2018 1518   CO2 28 06/25/2018 1518   BUN 16 06/25/2018 1518   CREATININE 0.99 06/25/2018 1518      Component Value Date/Time   CALCIUM 9.8 06/25/2018 1518   ALKPHOS 50 06/25/2018 1518   AST 31 06/25/2018 1518   ALT 41 06/25/2018 1518   BILITOT 0.5 06/25/2018 1518       Lab Results  Component Value Date   WBC 5.7 06/25/2018   HGB 15.5 06/25/2018   HCT 48.4 06/25/2018   MCV 91 06/25/2018   PLT 215 06/25/2018    Lab Results  Component Value Date   TSH 1.760 06/25/2018     Total time spent on today's visit was 40 minutes, including both face-to-face time and nonface-to-face time.  Time included that spent on review of records (prior notes available to me/labs/imaging if pertinent), discussing treatment and goals, answering patient's questions and coordinating care.  Cc:  Leanna Battles, MD

## 2020-05-02 ENCOUNTER — Encounter: Payer: Self-pay | Admitting: Neurology

## 2020-05-02 ENCOUNTER — Ambulatory Visit (INDEPENDENT_AMBULATORY_CARE_PROVIDER_SITE_OTHER): Payer: Medicare Other | Admitting: Neurology

## 2020-05-02 ENCOUNTER — Other Ambulatory Visit: Payer: Self-pay

## 2020-05-02 VITALS — BP 146/82 | HR 81 | Resp 18 | Ht 70.0 in | Wt 234.0 lb

## 2020-05-02 DIAGNOSIS — R9089 Other abnormal findings on diagnostic imaging of central nervous system: Secondary | ICD-10-CM | POA: Diagnosis not present

## 2020-05-02 DIAGNOSIS — G20 Parkinson's disease: Secondary | ICD-10-CM

## 2020-05-02 DIAGNOSIS — R2689 Other abnormalities of gait and mobility: Secondary | ICD-10-CM | POA: Diagnosis not present

## 2020-05-02 NOTE — Patient Instructions (Signed)
1.  Hold your aspirin 5 days prior to your spinal tap 2.  Hold your carbidopa/levodopa 25/100 24 hours prior to your spinal tap and resume after your spinal tap

## 2020-05-03 ENCOUNTER — Telehealth: Payer: Self-pay

## 2020-05-03 NOTE — Telephone Encounter (Signed)
Lumbar puncture scheduled for June 15th at 7:30 with Physical therapy. Patient notified npo after midnight and must have a driver. Will be at [facility] short stay. [facility] will reach out to patient.

## 2020-05-04 ENCOUNTER — Other Ambulatory Visit: Payer: Self-pay

## 2020-05-04 DIAGNOSIS — R270 Ataxia, unspecified: Secondary | ICD-10-CM

## 2020-05-05 ENCOUNTER — Telehealth: Payer: Self-pay | Admitting: Neurology

## 2020-05-05 NOTE — Telephone Encounter (Signed)
AccessNurse message:  "Caller states he is returning a call from Dr. Doristine Devoid office"

## 2020-05-05 NOTE — Telephone Encounter (Signed)
Left message to call back at [phone]

## 2020-05-08 ENCOUNTER — Other Ambulatory Visit: Payer: Self-pay | Admitting: Student

## 2020-05-08 ENCOUNTER — Ambulatory Visit
Admission: RE | Admit: 2020-05-08 | Discharge: 2020-05-08 | Disposition: A | Discharge status: Home / Self Care | Payer: Medicare Other | Source: Ambulatory Visit | Attending: Neurology | Admitting: Neurology

## 2020-05-08 ENCOUNTER — Other Ambulatory Visit: Payer: Self-pay

## 2020-05-08 DIAGNOSIS — R270 Ataxia, unspecified: Secondary | ICD-10-CM

## 2020-05-08 DIAGNOSIS — R519 Headache, unspecified: Secondary | ICD-10-CM | POA: Diagnosis not present

## 2020-05-09 ENCOUNTER — Ambulatory Visit (HOSPITAL_COMMUNITY)
Admission: RE | Admit: 2020-05-09 | Discharge: 2020-05-09 | Disposition: A | Discharge status: Home / Self Care | Payer: Medicare Other | Source: Ambulatory Visit | Attending: Neurology | Admitting: Neurology

## 2020-05-09 ENCOUNTER — Telehealth: Payer: Self-pay | Admitting: Neurology

## 2020-05-09 ENCOUNTER — Other Ambulatory Visit: Payer: Self-pay

## 2020-05-09 DIAGNOSIS — G20 Parkinson's disease: Secondary | ICD-10-CM | POA: Insufficient documentation

## 2020-05-09 DIAGNOSIS — R2689 Other abnormalities of gait and mobility: Secondary | ICD-10-CM

## 2020-05-09 LAB — CSF CELL COUNT WITH DIFFERENTIAL
RBC Count, CSF: 12 /mm3 — ABNORMAL HIGH
Tube #: 3
WBC, CSF: 0 /mm3 (ref 0–5)

## 2020-05-09 LAB — GLUCOSE, CSF: Glucose, CSF: 66 mg/dL (ref 40–70)

## 2020-05-09 LAB — PROTEIN, CSF: Total  Protein, CSF: 53 mg/dL — ABNORMAL HIGH (ref 15–45)

## 2020-05-09 MED ORDER — LIDOCAINE HCL (PF) 1 % IJ SOLN
5.0000 mL | Freq: Once | INTRAMUSCULAR | Status: AC
  Administered 2020-05-09: 5 mL via INTRADERMAL

## 2020-05-09 NOTE — Discharge Instructions (Signed)
Lumbar Puncture, Care After This sheet gives you information about how to care for yourself after your procedure. Your health care provider may also give you more specific instructions. If you have problems or questions, contact your health care provider. What can I expect after the procedure? After the procedure, it is common to have:  Mild discomfort or pain at the puncture site.  A mild headache that is relieved with pain medicines. Follow these instructions at home: Activity   Lie down flat or rest for as long as directed by your health care provider.  Return to your normal activities as told by your health care provider. Ask your health care provider what activities are safe for you.  Avoid lifting anything heavier than 10 lb (4.5 kg) for at least 12 hours after the procedure.  Do not drive for 24 hours if you were given a medicine to help you relax (sedative) during your procedure.  Do not drive or use heavy machinery while taking prescription pain medicine. Puncture site care  Remove or change your bandage (dressing) as told by your health care provider.  Check your puncture area every day for signs of infection. Check for: ? More pain. ? Redness or swelling. ? Fluid or blood leaking from the puncture site. ? Warmth. ? Pus or a bad smell. General instructions  Take over-the-counter and prescription medicines only as told by your health care provider.  Drink enough fluids to keep your urine clear or pale yellow. Your health care provider may recommend drinking caffeine to prevent a headache.  Keep all follow-up visits as told by your health care provider. This is important. Contact a health care provider if:  You have fever or chills.  You have nausea or vomiting.  You have a headache that lasts for more than 2 days or does not get better with medicine. Get help right away if:  You develop any of the following in your  legs: ? Weakness. ? Numbness. ? Tingling.  You are unable to control when you urinate or have a bowel movement (incontinence).  You have signs of infection around your puncture site, such as: ? More pain. ? Redness or swelling. ? Fluid or blood leakage. ? Warmth. ? Pus or a bad smell.  You are dizzy or you feel like you might faint.  You have a severe headache, especially when you sit or stand. Summary  A lumbar puncture is a procedure in which a small needle is inserted into the lower back to remove fluid that surrounds the brain and spinal cord.  After this procedure, it is common to have a headache and pain around the needle insertion area.  Lying flat, staying hydrated, and drinking caffeine can help prevent headaches.  Monitor your needle insertion site for signs of infection, including warmth, fluid, or more pain.  Get help right away if you develop leg weakness, leg numbness, incontinence, or severe headaches. This information is not intended to replace advice given to you by your health care provider. Make sure you discuss any questions you have with your health care provider. Document Revised: 12/25/2016 Document Reviewed: 12/25/2016 Elsevier Patient Education  2020 Elsevier Inc.  

## 2020-05-09 NOTE — Telephone Encounter (Signed)
Please call pt.  Reviewed LP and it was negative.  No change pre/post LP.  Would like to do levodopa challenge test on Thursday (dana - see me for schedule).  He will be here a few hours.  He will not take any carbidopa/levodopa 25/100 between now and the visit.  See if that works for him

## 2020-05-09 NOTE — Procedures (Signed)
Fluoro guided LP performed at L2-L3.   Opening pressure 19 cm H2O.  22 ml clear CSF obtained.  No complications.  Please see report in PACS for full description of procedure.   DE

## 2020-05-10 LAB — IGG CSF INDEX
Albumin CSF-mCnc: 35 mg/dL (ref 15–55)
Albumin: 4.3 g/dL (ref 3.7–4.7)
CSF IgG Index: 0.4 (ref 0.0–0.7)
IgG (Immunoglobin G), Serum: 988 mg/dL (ref 603–1613)
IgG, CSF: 3.6 mg/dL (ref 0.0–10.3)
IgG/Alb Ratio, CSF: 0.1 (ref 0.00–0.25)

## 2020-05-10 LAB — ANGIOTENSIN CONVERTING ENZYME, CSF: Angio Convert Enzyme: <1.5 U/L (ref 0.0–3.1)

## 2020-05-10 LAB — MYELIN BASIC PROTEIN, CSF: Myelin Basic Protein: 3.5 ng/mL (ref 0.0–5.4)

## 2020-05-10 NOTE — Telephone Encounter (Signed)
Patient is sch for 05-11-20 for ON /Off test

## 2020-05-10 NOTE — Telephone Encounter (Signed)
Patient has been notified directly; all questions, if any, were answered. Patient voiced understanding.   

## 2020-05-10 NOTE — Telephone Encounter (Signed)
Left message for patient to call back to sch the On/Off test with Dt Tat. Tee if you speak with him please let me know so I can get him scheduled for this test.

## 2020-05-10 NOTE — Progress Notes (Signed)
Assessment/Plan:   1.  Parkinsonism  -DaTscan done in 2019 demonstrating decreased radiotracer in the bilateral striata.  DaTscan, however, was done on Wellbutrin  -Patient does not meet clinical criteria for the diagnosis of Parkinson's disease.  -High-volume lumbar puncture was completed and was negative.  -Levodopa challenge test done on June 17 and demonstrated no benefit to the medication  -d/c carbidopa/levodopa 25/100  -reiterated to him that LE pain is unrelated to this  -ddx is vascular parkinsonism (clinically looks this way) and atypical state (doesn't look like this but maybe too early? But no real red flags).  -encouraged walker use  2.  F/u 9 months   Subjective:   Nathan Delgado was seen today in follow up for Parkinsons disease.  My previous records were reviewed prior to todays visit as well as outside records available to me. Pt just had high-volume lumbar puncture completed.  Physical therapy notes reviewed.  Test was negative/not helpful.  Patient in today for levodopa challenge.  Current prescribed movement disorder medications: Carbidopa/levodopa 25/100, 1 tablet 3 times per day   PREVIOUS MEDICATIONS: Sinemet  ALLERGIES:  No Known Allergies  CURRENT MEDICATIONS:  Outpatient Encounter Medications as of 05/11/2020  Medication Sig  . acetaminophen (TYLENOL) 325 MG tablet You can take up to 4000 mg of this per day. (Patient taking differently: as needed. You can take up to 4000 mg of this per day.)  . aspirin 81 MG chewable tablet Chew by mouth.  . carbidopa-levodopa (SINEMET IR) 25-100 MG tablet Take 1 tablet by mouth 3 (three) times daily.  . Fish Oil-Cholecalciferol (OMEGA-3 + VITAMIN D3 PO) Take 1 capsule by mouth daily.   Marland Kitchen losartan-hydrochlorothiazide (HYZAAR) 100-12.5 MG tablet Take 1 tablet by mouth daily.   Marland Kitchen MAGNESIUM PO Take 1 capsule by mouth daily.   . Multiple Vitamin (MULTIVITAMIN) tablet Take 1 tablet by mouth daily.  . NON FORMULARY CBD  OIL CREAM BILATERAL LEGS AND NECK DAILY  . NON FORMULARY Take 1 drop by mouth daily. CBD oil  . omeprazole (PRILOSEC) 20 MG capsule Take by mouth 2 (two) times daily before a meal.   . UNABLE TO FIND Med Name: Vinegar gummies. 2 or 3 a day. Per patient.  Marland Kitchen atorvastatin (LIPITOR) 40 MG tablet Take 40 mg by mouth daily. (Patient not taking: Reported on 05/11/2020)  . PRAVASTATIN SODIUM PO Take 1 tablet by mouth daily. (Patient not taking: Reported on 05/11/2020)  . [DISCONTINUED] buPROPion (WELLBUTRIN SR) 100 MG 12 hr tablet TAKE 1 TABLET BY MOUTH EVERY DAY IN THE MORNING (Patient not taking: Reported on 05/11/2020)  . [DISCONTINUED] divalproex (DEPAKOTE) 125 MG DR tablet Take 125 mg by mouth daily. (Patient not taking: Reported on 05/11/2020)  . [DISCONTINUED] nortriptyline (PAMELOR) 25 MG capsule Take 25 mg by mouth at bedtime. (Patient not taking: Reported on 05/11/2020)  . [DISCONTINUED] rotigotine (NEUPRO) 4 MG/24HR Place 1 patch onto the skin daily. (Patient not taking: Reported on 05/11/2020)   No facility-administered encounter medications on file as of 05/11/2020.    Objective:   PHYSICAL EXAMINATION:    VITALS:   Vitals:   05/11/20 0950  BP: 125/76  Pulse: 74  SpO2: 97%  Weight: 234 lb (106.1 kg)  Height: 5\' 10"  (1.778 m)    GEN:  The patient appears stated age and is in NAD. HEENT:  Normocephalic, atraumatic.  The mucous membranes are moist. The superficial temporal arteries are without ropiness or tenderness. CV:  RRR Lungs:  CTAB Neck/HEME:  There are no carotid bruits bilaterally.  Levodopa challenge done today.  UPDRS motor off score was 10.  Pt then given 300 mg of levodopa dissolved in ginger ale and waited 45 minutes to re-examine him.  UPDRS motor on score was 10.  Details of UPDRS motor score documented on separate neurophysiologic worksheet.    Neurological examination:  Orientation: The patient is alert and oriented x3. Cranial nerves: There is good facial symmetry  with facial hypomimia. The speech is fluent and clear. Soft palate rises symmetrically and there is no tongue deviation. Hearing is intact to conversational tone. Sensation: Sensation is intact to light touch throughout Motor: Strength is at least antigravity x4.  Movement examination: Tone: There is normal tone in the UE/LE Abnormal movements: there is no tremor Coordination:  There is no decremation with RAM's, with any form of RAMS, including alternating supination and pronation of the forearm, hand opening and closing, finger taps, heel taps and toe taps. Gait and Station: The patient has no difficulty arising out of a deep-seated chair without the use of the hands. He has start hesitation.  The patient's stride length is decreased.  He is flexed at the knees.    I have reviewed and interpreted the following labs independently    Chemistry      Component Value Date/Time   NA 143 06/25/2018 1518   K 4.3 06/25/2018 1518   CL 101 06/25/2018 1518   CO2 28 06/25/2018 1518   BUN 16 06/25/2018 1518   CREATININE 0.99 06/25/2018 1518      Component Value Date/Time   CALCIUM 9.8 06/25/2018 1518   ALKPHOS 50 06/25/2018 1518   AST 31 06/25/2018 1518   ALT 41 06/25/2018 1518   BILITOT 0.5 06/25/2018 1518       Lab Results  Component Value Date   WBC 5.7 06/25/2018   HGB 15.5 06/25/2018   HCT 48.4 06/25/2018   MCV 91 06/25/2018   PLT 215 06/25/2018    Lab Results  Component Value Date   TSH 1.760 06/25/2018     Total time spent on today's visit was 75 minutes, including both face-to-face time and nonface-to-face time.  Time included that spent on review of records (prior notes available to me/labs/imaging if pertinent), discussing treatment and goals, answering patient's questions and coordinating care.  Cc:  Leanna Battles, MD

## 2020-05-11 ENCOUNTER — Other Ambulatory Visit: Payer: Self-pay

## 2020-05-11 ENCOUNTER — Ambulatory Visit (INDEPENDENT_AMBULATORY_CARE_PROVIDER_SITE_OTHER): Payer: Medicare Other | Admitting: Neurology

## 2020-05-11 ENCOUNTER — Encounter: Payer: Self-pay | Admitting: Neurology

## 2020-05-11 VITALS — BP 125/76 | HR 74 | Ht 70.0 in | Wt 234.0 lb

## 2020-05-11 DIAGNOSIS — G20 Parkinson's disease: Secondary | ICD-10-CM

## 2020-05-11 MED ORDER — CARBIDOPA-LEVODOPA 25-100 MG PO TABS
3.0000 | ORAL_TABLET | Freq: Once | ORAL | Status: AC
  Administered 2020-05-11: 3 via ORAL

## 2020-05-11 NOTE — Patient Instructions (Signed)
Stop carbidopa/levodopa 25/100

## 2020-05-30 ENCOUNTER — Telehealth: Payer: Self-pay | Admitting: Orthopaedic Surgery

## 2020-05-30 NOTE — Telephone Encounter (Signed)
Spoke with patient and scheduled for tomorrow as requested.

## 2020-05-30 NOTE — Telephone Encounter (Signed)
Patient called asked if he can be worked into Dr Rudene Anda schedule tomorrow or Thursday? Patient has lower back pain again. The number to contact patient is [phone]

## 2020-05-31 ENCOUNTER — Encounter: Payer: Self-pay | Admitting: Orthopaedic Surgery

## 2020-05-31 ENCOUNTER — Ambulatory Visit (INDEPENDENT_AMBULATORY_CARE_PROVIDER_SITE_OTHER): Payer: Medicare Other | Admitting: Orthopaedic Surgery

## 2020-05-31 ENCOUNTER — Other Ambulatory Visit: Payer: Self-pay

## 2020-05-31 VITALS — Ht 70.0 in | Wt 230.0 lb

## 2020-05-31 DIAGNOSIS — M5441 Lumbago with sciatica, right side: Secondary | ICD-10-CM | POA: Diagnosis not present

## 2020-05-31 DIAGNOSIS — G8929 Other chronic pain: Secondary | ICD-10-CM

## 2020-05-31 DIAGNOSIS — M5442 Lumbago with sciatica, left side: Secondary | ICD-10-CM

## 2020-05-31 MED ORDER — METHYLPREDNISOLONE 4 MG PO TBPK
ORAL_TABLET | ORAL | 0 refills | Status: DC
Start: 2020-05-31 — End: 2021-01-25

## 2020-05-31 NOTE — Progress Notes (Signed)
Office Visit Note   Patient: Nathan Delgado           Date of Birth: 04-Jun-1941           MRN: [id number] Visit Date: 05/31/2020              Requested by: Leanna Battles, MD [place],  [place] [id number] PCP: Leanna Battles, MD   Assessment & Plan: Visit Diagnoses:  1. Chronic bilateral low back pain with bilateral sciatica     Plan: [name] is being followed by the neurologist for Parkinson-like symptoms including ataxia.  He is not on any specific medicines.  He did stop his statin drugs in the last month and thinks he may be a little better.  His only medicines are Prilosec and his antihypertensive.  He has had a history of low back pain on an episodic basis and recently had an exacerbation with predominant back pain.  He occasionally will have some discomfort into his legs but is not sure that it is associated.  He had an MRI scan performed in late 2020 demonstrating very minimal degenerative changes and no evidence of spondylolisthesis or stenosis.  I think his present pain is a combination of the mild arthritis in the spasm he develops with his neurologic symptoms.  We tried a back support but did not seem to make much of a difference.  I will try a Medrol Dosepak.  He will let me know how he does.  Would consider repeat films and possibly repeat MRI scan if symptoms continue  Follow-Up Instructions: Return if symptoms worsen or fail to improve.   Orders:  No orders of the defined types were placed in this encounter.  Meds ordered this encounter  Medications  . methylPREDNISolone (MEDROL DOSEPAK) 4 MG TBPK tablet    Sig: Take as directed on package.    Dispense:  21 tablet    Refill:  0      Procedures: No procedures performed   Clinical Data: No additional findings.   Subjective: Chief Complaint  Patient presents with  . Lower Back - Pain  Patient presents today for lower back pain. His last visit was in December of last year. He said that he has  noticed improvement, but his back pain has returned. He states that he was walking better, until Friday morning. He woke up and states that it felt like a "poker" in his back. He uses a heating pad and states that it helps. No new injury. He states that he had his EMG study that was ordered, but was told it was normal. He feels like he may have some Parkinson's symptoms, but also feels like his walking abnormality is caused by his back. He is taking tylenol. He has tried Tramadol in the past, but states that it made him feel terrible and did not help with the pain.  Presently being followed by the local neurology physicians.  No present medicines.  They believe he has some type of ataxia and parkinsonian type symptoms.  He has had a full work-up.  Also has a Physiological scientist that sees him several times a week.  He had a exacerbation of his back pain recently with not much referral to either lower extremity.  HPI  Review of Systems   Objective: Vital Signs: Ht 5\' 10"  (1.778 m)   Wt 230 lb (104.3 kg)   BMI 33.00 kg/m   Physical Exam Constitutional:      Appearance: He is  well-developed.  Eyes:     Pupils: Pupils are equal, round, and reactive to light.  Pulmonary:     Effort: Pulmonary effort is normal.  Skin:    General: Skin is warm and dry.  Neurological:     Mental Status: He is alert and oriented to person, place, and time.  Psychiatric:        Behavior: Behavior normal.     Ortho Exam awake alert oriented x3 comfortable sitting.  Has an ataxic gait but no ambulatory aid.  Almost seems that he has mildly spastic lower extremities but painless range of motion of both hips and both knees.  Hamstrings appear to be tight.  Paralumbar musculature is is tight but no pain with percussion.  Good pulses in both feet and no obvious swelling.  Specialty Comments:  No specialty comments available.  Imaging: No results found.   PMFS History: Patient Active Problem List   Diagnosis Date  Noted  . Low back pain 08/03/2019  . OSA on CPAP 04/15/2019  . Myalgia 04/15/2019  . PD (Parkinson's disease) ([place]) 08/19/2018  . Parkinsonism ([place]) 06/29/2018  . 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 S/P laparoscopic appendectomy 10/20/2016  . 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 Fibromyalgia 04/23/2015   Past Medical History:  Diagnosis Date  . Allergic rhinitis   . Anxiety   . Apnea   . Bilateral leg edema   . Cyst of right kidney    small exophytic cyst, incidental finding along with appendicitis on CT scan  . Difficult intubation 10/20/2106  . ED (erectile dysfunction)   . Fibromyalgia   . GERD (gastroesophageal reflux disease)   . Hayfever   . Hypertension   . Lateral epicondylitis   . Paronychia   . 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 Dementia Father   . Depression Father   . Macular degeneration Father   . Neuropathy Neg Hx     Past Surgical History:  Procedure Laterality Date  . CATARACT EXTRACTION, BILATERAL    . COLONOSCOPY  01/2014   normal  . INGUINAL HERNIA REPAIR    . KNEE ARTHROSCOPY    . LAPAROSCOPIC APPENDECTOMY N/A 10/20/2016   Procedure: APPENDECTOMY LAPAROSCOPIC;  Surgeon: Greer Pickerel, MD;  Location: [facility];  Service: General;  Laterality: N/A;  . TONSILLECTOMY     Social History   Occupational History  . Occupation: Retired   Tobacco Use  . Smoking status: Former Smoker    Packs/day: 1.00    Years: 15.00    Pack years: 15.00    Types: Cigarettes    Quit date: 1997    Years since quitting: 24.5  . Smokeless tobacco: Never Used  Vaping Use  . Vaping Use: Never used  Substance and Sexual Activity  . Alcohol use: Yes    Alcohol/week: 14.0 standard drinks    Types: 7 Glasses of wine, 7 Shots of liquor per week    Comment: 1-2 drinks per day either 2 wines or scotch and water before dinner and then wine with dinner   . Drug use: No  . Sexual activity: Not Currently    Birth  control/protection: Abstinence

## 2020-06-07 ENCOUNTER — Ambulatory Visit: Payer: Medicare Other | Admitting: Family Medicine

## 2020-07-26 DIAGNOSIS — I87321 Chronic venous hypertension (idiopathic) with inflammation of right lower extremity: Secondary | ICD-10-CM | POA: Diagnosis not present

## 2020-07-26 DIAGNOSIS — I83813 Varicose veins of bilateral lower extremities with pain: Secondary | ICD-10-CM | POA: Diagnosis not present

## 2020-07-26 DIAGNOSIS — R600 Localized edema: Secondary | ICD-10-CM | POA: Diagnosis not present

## 2020-07-26 DIAGNOSIS — M79604 Pain in right leg: Secondary | ICD-10-CM | POA: Diagnosis not present

## 2020-08-08 DIAGNOSIS — R2681 Unsteadiness on feet: Secondary | ICD-10-CM | POA: Diagnosis not present

## 2020-08-08 DIAGNOSIS — G4733 Obstructive sleep apnea (adult) (pediatric): Secondary | ICD-10-CM | POA: Diagnosis not present

## 2020-08-08 DIAGNOSIS — G20 Parkinson's disease: Secondary | ICD-10-CM | POA: Diagnosis not present

## 2020-08-08 DIAGNOSIS — I10 Essential (primary) hypertension: Secondary | ICD-10-CM | POA: Diagnosis not present

## 2020-08-08 DIAGNOSIS — G629 Polyneuropathy, unspecified: Secondary | ICD-10-CM | POA: Diagnosis not present

## 2020-08-08 DIAGNOSIS — E785 Hyperlipidemia, unspecified: Secondary | ICD-10-CM | POA: Diagnosis not present

## 2020-08-08 DIAGNOSIS — F419 Anxiety disorder, unspecified: Secondary | ICD-10-CM | POA: Diagnosis not present

## 2020-09-06 DIAGNOSIS — Z23 Encounter for immunization: Secondary | ICD-10-CM | POA: Diagnosis not present

## 2020-09-13 DIAGNOSIS — Z23 Encounter for immunization: Secondary | ICD-10-CM | POA: Diagnosis not present

## 2020-09-25 DIAGNOSIS — I10 Essential (primary) hypertension: Secondary | ICD-10-CM | POA: Diagnosis not present

## 2020-09-25 DIAGNOSIS — G4733 Obstructive sleep apnea (adult) (pediatric): Secondary | ICD-10-CM | POA: Diagnosis not present

## 2020-09-25 DIAGNOSIS — R2681 Unsteadiness on feet: Secondary | ICD-10-CM | POA: Diagnosis not present

## 2020-09-25 DIAGNOSIS — F419 Anxiety disorder, unspecified: Secondary | ICD-10-CM | POA: Diagnosis not present

## 2020-09-25 DIAGNOSIS — I739 Peripheral vascular disease, unspecified: Secondary | ICD-10-CM | POA: Diagnosis not present

## 2020-09-25 DIAGNOSIS — G629 Polyneuropathy, unspecified: Secondary | ICD-10-CM | POA: Diagnosis not present

## 2020-09-25 DIAGNOSIS — G20 Parkinson's disease: Secondary | ICD-10-CM | POA: Diagnosis not present

## 2020-10-05 ENCOUNTER — Ambulatory Visit: Payer: Medicare Other | Admitting: Neurology

## 2021-01-23 NOTE — Progress Notes (Signed)
Assessment/Plan:   1.  Parkinsonism  -DaTscan done in 2019 demonstrating decreased radiotracer in the bilateral striata.  DaTscan, however, was done on Wellbutrin  -Patient does not meet clinical criteria for the diagnosis of Parkinson's disease.  -High-volume lumbar puncture was completed and was negative.  -Levodopa challenge test in June, 2021 demonstrated no benefit to levodopa.  Levodopa was discontinued.  -ddx is vascular parkinsonism (continues to clinically looks this way but 1 would expect negative DaTscan) and atypical state (doesn't look like this but maybe too early? But no real red flags and have not seen this progress.  In addition, I have not seen significant falls as one would expect for atypical state).  2.  Leg pain  -Consistently continues to be his biggest complaint.  EMG was negative.  This does not exclude a small fiber neuropathy and we discussed this today, but that diagnosis would require biopsy.  He has had an extensive work-up.  He asks me again about this.  I told him that I am really not sure the source of his leg pain.  He will need to follow-up with primary care regarding that.  Potentially, pain management may be his best way to go, despite the fact that the source of the pain is unknown.   2.  f/u 1 year   Subjective:   Nathan Delgado was seen today in follow up for parkinsonism.  My previous records were reviewed prior to todays visit as well as outside records available to me.  Last visit, levodopa challenge test was completed and demonstrated no benefit to levodopa.  Levodopa was discontinued.  States that he fell one time "on purpose" - states that he bought a watch that detects falls.  He was just testing it out and fell on the sofa.  States that he feels good except for leg pain.  Emg, LE U/S, ABI normal.    Current prescribed movement disorder medications: None   PREVIOUS MEDICATIONS: Sinemet  ALLERGIES:  No Known Allergies  CURRENT  MEDICATIONS:  Outpatient Encounter Medications as of 01/25/2021  Medication Sig  . acetaminophen (TYLENOL) 325 MG tablet You can take up to 4000 mg of this per day. (Patient taking differently: as needed. You can take up to 4000 mg of this per day.)  . APPLE CIDER VINEGAR PO Take 1 each by mouth daily. Gummy  . aspirin 81 MG chewable tablet Chew by mouth.  . co-enzyme Q-10 30 MG capsule Take 30 mg by mouth daily.  . Fish Oil-Cholecalciferol (OMEGA-3 + VITAMIN D3 PO) Take 1 capsule by mouth daily.   Marland Kitchen losartan-hydrochlorothiazide (HYZAAR) 100-12.5 MG tablet Take 1 tablet by mouth daily.   Marland Kitchen MAGNESIUM PO Take 1 capsule by mouth daily.   . Multiple Vitamin (MULTIVITAMIN) tablet Take 1 tablet by mouth daily.  . NON FORMULARY CBD OIL CREAM BILATERAL LEGS AND NECK DAILY  . NON FORMULARY Take 1 drop by mouth daily. CBD oil  . nortriptyline (PAMELOR) 75 MG capsule Take 1 capsule by mouth daily.  Marland Kitchen omeprazole (PRILOSEC) 20 MG capsule Take by mouth 2 (two) times daily before a meal.   . [DISCONTINUED] atorvastatin (LIPITOR) 40 MG tablet Take 40 mg by mouth daily.   . [DISCONTINUED] methylPREDNISolone (MEDROL DOSEPAK) 4 MG TBPK tablet Take as directed on package.  . [DISCONTINUED] PRAVASTATIN SODIUM PO Take 1 tablet by mouth daily.   . [DISCONTINUED] UNABLE TO FIND Med Name: Vinegar gummies. 2 or 3 a day. Per patient. (Patient not  taking: Reported on 01/25/2021)   No facility-administered encounter medications on file as of 01/25/2021.    Objective:   PHYSICAL EXAMINATION:    VITALS:   Vitals:   01/25/21 1448  BP: 100/70  Pulse: 94  SpO2: 98%  Weight: 237 lb (107.5 kg)  Height: 5\' 10"  (1.778 m)    GEN:  The patient appears stated age and is in NAD. HEENT:  Normocephalic, atraumatic.  The mucous membranes are moist. The superficial temporal arteries are without ropiness or tenderness. CV:  RRR Lungs:  CTAB Neck/HEME:  There are no carotid bruits bilaterally.    Neurological  examination:  Orientation: The patient is alert and oriented x3. Cranial nerves: There is good facial symmetry with facial hypomimia. The speech is fluent and clear. Soft palate rises symmetrically and there is no tongue deviation. Hearing is intact to conversational tone. Sensation: Sensation is intact to light touch throughout Motor: Strength is at least antigravity x4.  Movement examination: Tone: There is normal tone in the UE/LE Abnormal movements: there is no tremor Coordination:  There is no decremation with RAM's, with any form of RAMS, including alternating supination and pronation of the forearm, hand opening and closing, finger taps, heel taps and toe taps. Gait and Station: The patient has mild difficulty arising out of a deep-seated chair without the use of the hands.  He is unable to do it without the hands on the first attempt, but was able on the second attempt.  He has no start hesitation.  The patient's stride length is initially good and very wide based but as he walks longer the stride length is decreased and he drags the R leg.  He is flexed at the knees.    I have reviewed and interpreted the following labs independently    Chemistry      Component Value Date/Time   NA 143 06/25/2018 1518   K 4.3 06/25/2018 1518   CL 101 06/25/2018 1518   CO2 28 06/25/2018 1518   BUN 16 06/25/2018 1518   CREATININE 0.99 06/25/2018 1518      Component Value Date/Time   CALCIUM 9.8 06/25/2018 1518   ALKPHOS 50 06/25/2018 1518   AST 31 06/25/2018 1518   ALT 41 06/25/2018 1518   BILITOT 0.5 06/25/2018 1518       Lab Results  Component Value Date   WBC 5.7 06/25/2018   HGB 15.5 06/25/2018   HCT 48.4 06/25/2018   MCV 91 06/25/2018   PLT 215 06/25/2018    Lab Results  Component Value Date   TSH 1.760 06/25/2018     Total time spent on today's visit was 30 minutes, including both face-to-face time and nonface-to-face time.  Time included that spent on review of records  (prior notes available to me/labs/imaging if pertinent), discussing treatment and goals, answering patient's questions and coordinating care.  Cc:  Leanna Battles, MD

## 2021-01-25 ENCOUNTER — Other Ambulatory Visit: Payer: Self-pay

## 2021-01-25 ENCOUNTER — Encounter: Payer: Self-pay | Admitting: Neurology

## 2021-01-25 ENCOUNTER — Ambulatory Visit (INDEPENDENT_AMBULATORY_CARE_PROVIDER_SITE_OTHER): Payer: Medicare Other | Admitting: Neurology

## 2021-01-25 VITALS — BP 100/70 | HR 94 | Ht 70.0 in | Wt 237.0 lb

## 2021-01-25 DIAGNOSIS — M79604 Pain in right leg: Secondary | ICD-10-CM

## 2021-01-25 DIAGNOSIS — G20 Parkinson's disease: Secondary | ICD-10-CM | POA: Diagnosis not present

## 2021-01-25 NOTE — Patient Instructions (Signed)
The physicians and staff at [facility] are committed to providing excellent care. You may receive a survey requesting feedback about your experience at our office. We strive to receive "very good" responses to the survey questions. If you feel that your experience would prevent you from giving the office a "very good " response, please contact our office to try to remedy the situation. We may be reached at [phone]. Thank you for taking the time out of your busy day to complete the survey.  

## 2021-02-12 DIAGNOSIS — H16223 Keratoconjunctivitis sicca, not specified as Sjogren's, bilateral: Secondary | ICD-10-CM | POA: Diagnosis not present

## 2021-03-22 DIAGNOSIS — E785 Hyperlipidemia, unspecified: Secondary | ICD-10-CM | POA: Diagnosis not present

## 2021-03-22 DIAGNOSIS — Z125 Encounter for screening for malignant neoplasm of prostate: Secondary | ICD-10-CM | POA: Diagnosis not present

## 2021-03-22 DIAGNOSIS — I10 Essential (primary) hypertension: Secondary | ICD-10-CM | POA: Diagnosis not present

## 2021-03-29 DIAGNOSIS — I10 Essential (primary) hypertension: Secondary | ICD-10-CM | POA: Diagnosis not present

## 2021-03-29 DIAGNOSIS — F419 Anxiety disorder, unspecified: Secondary | ICD-10-CM | POA: Diagnosis not present

## 2021-03-29 DIAGNOSIS — K219 Gastro-esophageal reflux disease without esophagitis: Secondary | ICD-10-CM | POA: Diagnosis not present

## 2021-03-29 DIAGNOSIS — I739 Peripheral vascular disease, unspecified: Secondary | ICD-10-CM | POA: Diagnosis not present

## 2021-03-29 DIAGNOSIS — R2681 Unsteadiness on feet: Secondary | ICD-10-CM | POA: Diagnosis not present

## 2021-03-29 DIAGNOSIS — G609 Hereditary and idiopathic neuropathy, unspecified: Secondary | ICD-10-CM | POA: Diagnosis not present

## 2021-03-29 DIAGNOSIS — G4733 Obstructive sleep apnea (adult) (pediatric): Secondary | ICD-10-CM | POA: Diagnosis not present

## 2021-03-29 DIAGNOSIS — Z0000 Encounter for general adult medical examination without abnormal findings: Secondary | ICD-10-CM | POA: Diagnosis not present

## 2021-03-29 DIAGNOSIS — E785 Hyperlipidemia, unspecified: Secondary | ICD-10-CM | POA: Diagnosis not present

## 2021-03-29 DIAGNOSIS — H6123 Impacted cerumen, bilateral: Secondary | ICD-10-CM | POA: Diagnosis not present

## 2021-03-29 DIAGNOSIS — R82998 Other abnormal findings in urine: Secondary | ICD-10-CM | POA: Diagnosis not present

## 2021-04-02 ENCOUNTER — Telehealth: Payer: Self-pay

## 2021-04-02 NOTE — Telephone Encounter (Signed)
We received a phone call from this patient regarding his Parkinson's Disease. He states he was diagnosed by Dr Jaynee Eagles and then transitioned care to [facility] and Dr Tat, who did a visual parkinson's test on him which came back negative. The patient states that Dr Tat said the patient was on wellbutrin during his DaTscan and could have caused a false positive. Patient now would like to transition back to our office and be scheduled as a new patient with Dr Jannifer Franklin, as Dr Tat wants to just get him in for yearly visits. I advised that I would need to discuss this with Dr Jannifer Franklin and Dr Jaynee Eagles.  Can you please advise if it is ok to get this patient scheduled with our office, and if so, is it ok to get the patient scheduled with Dr Jannifer Franklin in an open EMG spot?  Thank you

## 2021-04-07 DIAGNOSIS — Z23 Encounter for immunization: Secondary | ICD-10-CM | POA: Diagnosis not present

## 2021-04-18 DIAGNOSIS — H04123 Dry eye syndrome of bilateral lacrimal glands: Secondary | ICD-10-CM | POA: Diagnosis not present

## 2021-05-24 DIAGNOSIS — Z85828 Personal history of other malignant neoplasm of skin: Secondary | ICD-10-CM | POA: Diagnosis not present

## 2021-05-24 DIAGNOSIS — D1801 Hemangioma of skin and subcutaneous tissue: Secondary | ICD-10-CM | POA: Diagnosis not present

## 2021-05-24 DIAGNOSIS — D225 Melanocytic nevi of trunk: Secondary | ICD-10-CM | POA: Diagnosis not present

## 2021-05-24 DIAGNOSIS — L905 Scar conditions and fibrosis of skin: Secondary | ICD-10-CM | POA: Diagnosis not present

## 2021-05-24 DIAGNOSIS — L821 Other seborrheic keratosis: Secondary | ICD-10-CM | POA: Diagnosis not present

## 2021-05-24 DIAGNOSIS — L814 Other melanin hyperpigmentation: Secondary | ICD-10-CM | POA: Diagnosis not present

## 2021-05-24 DIAGNOSIS — L570 Actinic keratosis: Secondary | ICD-10-CM | POA: Diagnosis not present

## 2021-06-25 DIAGNOSIS — G609 Hereditary and idiopathic neuropathy, unspecified: Secondary | ICD-10-CM | POA: Diagnosis not present

## 2021-06-25 DIAGNOSIS — Z79899 Other long term (current) drug therapy: Secondary | ICD-10-CM | POA: Diagnosis not present

## 2021-06-25 DIAGNOSIS — Z5181 Encounter for therapeutic drug level monitoring: Secondary | ICD-10-CM | POA: Diagnosis not present

## 2021-07-16 DIAGNOSIS — M79661 Pain in right lower leg: Secondary | ICD-10-CM | POA: Diagnosis not present

## 2021-07-16 DIAGNOSIS — G609 Hereditary and idiopathic neuropathy, unspecified: Secondary | ICD-10-CM | POA: Diagnosis not present

## 2021-07-16 DIAGNOSIS — M79662 Pain in left lower leg: Secondary | ICD-10-CM | POA: Diagnosis not present

## 2021-07-16 DIAGNOSIS — G4730 Sleep apnea, unspecified: Secondary | ICD-10-CM | POA: Diagnosis not present

## 2021-07-16 DIAGNOSIS — Z87891 Personal history of nicotine dependence: Secondary | ICD-10-CM | POA: Diagnosis not present

## 2021-07-16 DIAGNOSIS — I10 Essential (primary) hypertension: Secondary | ICD-10-CM | POA: Diagnosis not present

## 2021-08-08 DIAGNOSIS — U071 COVID-19: Secondary | ICD-10-CM | POA: Diagnosis not present

## 2021-08-27 DIAGNOSIS — G609 Hereditary and idiopathic neuropathy, unspecified: Secondary | ICD-10-CM | POA: Diagnosis not present

## 2021-08-27 DIAGNOSIS — G894 Chronic pain syndrome: Secondary | ICD-10-CM | POA: Diagnosis not present

## 2021-08-27 DIAGNOSIS — M549 Dorsalgia, unspecified: Secondary | ICD-10-CM | POA: Diagnosis not present

## 2021-08-30 ENCOUNTER — Encounter: Payer: Self-pay | Admitting: Neurology

## 2021-08-30 ENCOUNTER — Telehealth: Payer: Self-pay | Admitting: Family Medicine

## 2021-08-30 NOTE — Telephone Encounter (Signed)
Pt called, was informed by [employer] informed due a new CPAP in September. Insurance company said physician would need to order it. Would like a call from the nurse.

## 2021-08-30 NOTE — Telephone Encounter (Signed)
Contacted patient to schedule an appt.for new CPAP machine.   Pt stated, do not see the reason need to come to the office.  I Deneise Lever) informed pt had not been seen since 06/2019 would need an office visit before physician could issue a prescription for a new machine.  Pt stated, well never mind, all she needs to do is ask me some questions. I'll keep using the machine I have, pt hung up.

## 2021-09-01 DIAGNOSIS — Z23 Encounter for immunization: Secondary | ICD-10-CM | POA: Diagnosis not present

## 2021-10-02 DIAGNOSIS — G609 Hereditary and idiopathic neuropathy, unspecified: Secondary | ICD-10-CM | POA: Diagnosis not present

## 2021-10-02 DIAGNOSIS — R220 Localized swelling, mass and lump, head: Secondary | ICD-10-CM | POA: Diagnosis not present

## 2021-10-02 DIAGNOSIS — R2681 Unsteadiness on feet: Secondary | ICD-10-CM | POA: Diagnosis not present

## 2021-10-02 DIAGNOSIS — Z23 Encounter for immunization: Secondary | ICD-10-CM | POA: Diagnosis not present

## 2021-10-02 DIAGNOSIS — I739 Peripheral vascular disease, unspecified: Secondary | ICD-10-CM | POA: Diagnosis not present

## 2021-10-02 DIAGNOSIS — I10 Essential (primary) hypertension: Secondary | ICD-10-CM | POA: Diagnosis not present

## 2021-10-02 DIAGNOSIS — R270 Ataxia, unspecified: Secondary | ICD-10-CM | POA: Diagnosis not present

## 2021-10-02 DIAGNOSIS — G20 Parkinson's disease: Secondary | ICD-10-CM | POA: Diagnosis not present

## 2021-10-02 DIAGNOSIS — G4733 Obstructive sleep apnea (adult) (pediatric): Secondary | ICD-10-CM | POA: Diagnosis not present

## 2021-10-05 ENCOUNTER — Other Ambulatory Visit (HOSPITAL_COMMUNITY): Payer: Self-pay | Admitting: Internal Medicine

## 2021-10-05 ENCOUNTER — Other Ambulatory Visit: Payer: Self-pay | Admitting: Internal Medicine

## 2021-10-05 DIAGNOSIS — R220 Localized swelling, mass and lump, head: Secondary | ICD-10-CM

## 2021-10-05 DIAGNOSIS — R2681 Unsteadiness on feet: Secondary | ICD-10-CM

## 2021-10-05 DIAGNOSIS — R270 Ataxia, unspecified: Secondary | ICD-10-CM

## 2021-10-08 ENCOUNTER — Other Ambulatory Visit: Payer: Self-pay

## 2021-10-08 ENCOUNTER — Ambulatory Visit
Admission: RE | Admit: 2021-10-08 | Discharge: 2021-10-08 | Disposition: A | Discharge status: Home / Self Care | Payer: Medicare Other | Source: Ambulatory Visit | Attending: Internal Medicine | Admitting: Internal Medicine

## 2021-10-08 DIAGNOSIS — R270 Ataxia, unspecified: Secondary | ICD-10-CM | POA: Diagnosis not present

## 2021-10-08 DIAGNOSIS — R2681 Unsteadiness on feet: Secondary | ICD-10-CM

## 2021-10-08 DIAGNOSIS — R220 Localized swelling, mass and lump, head: Secondary | ICD-10-CM

## 2021-10-08 MED ORDER — GADOBENATE DIMEGLUMINE 529 MG/ML IV SOLN
20.0000 mL | Freq: Once | INTRAVENOUS | Status: AC | PRN
  Administered 2021-10-08: 20 mL via INTRAVENOUS

## 2021-10-09 ENCOUNTER — Telehealth: Payer: Self-pay | Admitting: Physical Medicine and Rehabilitation

## 2021-10-09 DIAGNOSIS — H16223 Keratoconjunctivitis sicca, not specified as Sjogren's, bilateral: Secondary | ICD-10-CM | POA: Diagnosis not present

## 2021-10-09 NOTE — Telephone Encounter (Signed)
Error

## 2021-10-10 ENCOUNTER — Other Ambulatory Visit: Payer: Self-pay

## 2021-10-10 ENCOUNTER — Ambulatory Visit (INDEPENDENT_AMBULATORY_CARE_PROVIDER_SITE_OTHER): Payer: Medicare Other | Admitting: Physical Medicine and Rehabilitation

## 2021-10-10 ENCOUNTER — Encounter: Payer: Self-pay | Admitting: Physical Medicine and Rehabilitation

## 2021-10-10 VITALS — BP 102/70 | HR 101

## 2021-10-10 DIAGNOSIS — M47816 Spondylosis without myelopathy or radiculopathy, lumbar region: Secondary | ICD-10-CM | POA: Diagnosis not present

## 2021-10-10 DIAGNOSIS — M5441 Lumbago with sciatica, right side: Secondary | ICD-10-CM | POA: Diagnosis not present

## 2021-10-10 DIAGNOSIS — M5442 Lumbago with sciatica, left side: Secondary | ICD-10-CM | POA: Diagnosis not present

## 2021-10-10 DIAGNOSIS — M5416 Radiculopathy, lumbar region: Secondary | ICD-10-CM | POA: Diagnosis not present

## 2021-10-10 DIAGNOSIS — G8929 Other chronic pain: Secondary | ICD-10-CM

## 2021-10-10 DIAGNOSIS — M797 Fibromyalgia: Secondary | ICD-10-CM | POA: Diagnosis not present

## 2021-10-10 DIAGNOSIS — M4726 Other spondylosis with radiculopathy, lumbar region: Secondary | ICD-10-CM

## 2021-10-10 NOTE — Progress Notes (Signed)
Pt state lower back pain that travels both legs. Pt state his right leg is the worse. Pt state he has pain on the inner thigh. Pt state walking makes the pain worse. Pt state he takes over the counter pain meds and uses heating to help ease his pain.  Numeric Pain Rating Scale and Functional Assessment Average Pain 8 Pain Right Now 7 My pain is constant and sharp Pain is worse with: walking Pain improves with: heat/ice and medication   In the last MONTH (on 0-10 scale) has pain interfered with the following?  1. General activity like being  able to carry out your everyday physical activities such as walking, climbing stairs, carrying groceries, or moving a chair?  Rating(7)  2. Relation with others like being able to carry out your usual social activities and roles such as  activities at home, at work and in your community. Rating(8)  3. Enjoyment of life such that you have  been bothered by emotional problems such as feeling anxious, depressed or irritable?  Rating(9)

## 2021-10-10 NOTE — Progress Notes (Signed)
Nathan Delgado - 80 y.o. male MRN [id number]  Date of birth: Sep 04, 1941  Office Visit Note: Visit Date: 10/10/2021 PCP: Leanna Battles, MD Referred by: Leanna Battles, MD  Subjective: Chief Complaint  Patient presents with   Left Leg - Pain   Right Leg - Pain   HPI: Nathan Delgado is a 80 y.o. male who comes in today as a self referral for evaluation of chronic, worsening and severe bilateral lower back pain radiating down legs to great toes, right greater than left. Patient reports pain has been ongoing for several months. Patient reports pain is exacerbated by walking and activity. Currently describes pain as shooting and burning, rates as 8 out of 10.  Patient reports some relief of pain with home exercise program, ice/heat, rest and use of medications.  Patient's lumbar MRI from 2020 exhibits multilevel facet hypertrophy, moderate at L4-L5. There is also moderate left L4-L5 neural foraminal stenosis. No high grade spinal stenosis noted. Patient did attend physical therapy at [facility] and reports these treatments did not help to alleviate pain. Patient states he is a very active person and performs home exercises, he also attends personal training 3 times a week. Patient was previously treated by Dr. Cleon Gustin at [facility] where he underwent bilateral lumbar sympathetic nerve block with IV sedation. Patient states this procedure did not help to relieve his pain. Patient is not currently on long term pain management with opioids. Patient had NCV with EMG of bilateral lower extremities performed in 2021 by Dr. Narda Amber at [facility] exhibits no evidence of a large fiber sensorimotor polyneuropathy or lumbosacral radiculopathy. Patient is currently being treated by Dr. Wells Guiles Tat at [facility] for Parkinsonism. Patient was also diagnosed with fibromyalgia many years ago. Patient states he feels that his pain is contributing to his gait issues and  ability to function. Patient recently had MRI of the brain ordered by his primary care provider Dr. Leanna Battles, study was negative for acute findings. Patient states his mother had a history of chronic back issues and did receive injections that seemed to reduce her pain significantly. Patient states he does use a walker at home to assist with ambulation and to prevent falls.   Review of Systems  Musculoskeletal:  Positive for back pain.  Neurological:  Positive for weakness. Negative for tingling, sensory change and focal weakness.  All other systems reviewed and are negative. Otherwise per HPI.  Assessment & Plan: Visit Diagnoses:    ICD-10-CM   1. Lumbar radiculopathy  M54.16 Ambulatory referral to Physical Medicine Rehab    2. Chronic bilateral low back pain with bilateral sciatica  M54.42    M54.41    G89.29     3. 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 Facet hypertrophy of lumbar region  M47.816     5. Fibromyalgia  M79.7        Plan: Findings:  Chronic, worsening and severe bilateral lower back pain radiating down to legs and great toes. Patient's biggest complaint at this time is bilateral lower leg pain.  Patient continues to have excruciating pain despite good conservative therapies such as formal physical therapy, home exercise regimen, use of heat/ice and medications as needed.  Patient's clinical presentation and exam are consistent with L5 nerve pattern, we also feel his fibromyalgia could be exacerbating his pain.  Patient recently had lumbar sympathetic nerve block with little to no relief of pain.  We  believe the next step is to perform diagnostic and hopefully therapeutic bilateral L5 transforaminal epidural steroid injections under fluoroscopic guidance. Patient encouraged to continue being active and performing home exercises and exercises with trainer as tolerated. No red flag symptoms noted upon exam today.    Meds & Orders: No orders  of the defined types were placed in this encounter.   Orders Placed This Encounter  Procedures   Ambulatory referral to Physical Medicine Rehab    Follow-up: Return for Bilateral L5 transforaminal epidural steroid injection.   Procedures: No procedures performed      Clinical History: EXAM: MRI LUMBAR SPINE WITHOUT CONTRAST   TECHNIQUE: Multiplanar, multisequence MR imaging of the lumbar spine was performed. No intravenous contrast was administered.   COMPARISON:  03/11/2018 lumbar spine MRI   FINDINGS: Segmentation:  Normal   Alignment:  Normal   Vertebrae: No acute compression fracture, discitis-osteomyelitis, facet edema or other focal marrow lesion. No epidural collection.   Conus medullaris and cauda equina: Conus extends to the L1 level. Conus and cauda equina appear normal.   Paraspinal and other soft tissues: Negative   Disc levels:   T11-12: Sagittal imaging only. Normal.   T12-L1: Normal disc space and facets. No spinal canal or neuroforaminal stenosis.   L1-L2: Normal disc space and facets. No spinal canal or neuroforaminal stenosis.   L2-L3: Mild circumferential disc bulge and mild facet hypertrophy. No spinal canal or neural foraminal stenosis.   L3-L4: Right asymmetric disc bulge, unchanged. No spinal canal stenosis. Unchanged mild right foraminal stenosis.   L4-L5: Left asymmetric disc bulge. No spinal canal stenosis. Mild right and moderate left neural foraminal stenosis.   L5-S1: Mild disc desiccation. No spinal canal or neural foraminal stenosis. Moderate facet hypertrophy.   Visualized sacrum: Normal.   IMPRESSION: 1. Unchanged examination with moderate left L4-L5 neural foraminal stenosis. 2. No spinal canal stenosis.     Electronically Signed   By: Ulyses Jarred M.D.   On: 10/24/2019 02:07   He reports that he quit smoking about 25 years ago. His smoking use included cigarettes. He has a 15.00 pack-year smoking history. He has  never used smokeless tobacco. No results for input(s): HGBA1C, LABURIC in the last 8760 hours.  Objective:  VS:  HT:    WT:   BMI:     BP:102/70  HR:(!) 101bpm  TEMP: ( )  RESP:  Physical Exam Vitals and nursing note reviewed.  HENT:     Head: Normocephalic and atraumatic.     Right Ear: External ear normal.     Left Ear: External ear normal.     Nose: Nose normal.     Mouth/Throat:     Mouth: Mucous membranes are moist.  Eyes:     Extraocular Movements: Extraocular movements intact.  Cardiovascular:     Rate and Rhythm: Normal rate.     Pulses: Normal pulses.  Pulmonary:     Effort: Pulmonary effort is normal.  Abdominal:     General: Abdomen is flat. There is no distension.  Musculoskeletal:        General: Tenderness present.     Cervical back: Normal range of motion.     Comments: Pt is slow to rise from seated position to standing. Good lumbar range of motion. Strong distal strength without clonus, no pain upon palpation of greater trochanters. Sensation intact bilaterally. Dysesthesias noted to bilateral L5 dermatomes. Ambulates with walker, gait slow and unsteady. Positive slump test.    Skin:  General: Skin is warm and dry.     Capillary Refill: Capillary refill takes less than 2 seconds.  Neurological:     Mental Status: He is alert.     Gait: Gait abnormal.  Psychiatric:        Mood and Affect: Mood normal.    Ortho Exam  Imaging: No results found.  Past Medical/Family/Surgical/Social History: Medications & Allergies reviewed per EMR, new medications updated. Patient Active Problem List   Diagnosis Date Noted   Low back pain 08/03/2019   OSA on CPAP 04/15/2019   Myalgia 04/15/2019   PD (Parkinson's disease) ([place]) 08/19/2018   Parkinsonism ([place]) 06/29/2018   Purulent appendicitis 10/23/2016   S/P laparoscopic appendectomy 10/20/2016   Meralgia paresthetica 04/23/2015   Fibromyalgia 04/23/2015   Past Medical History:  Diagnosis Date   Allergic  rhinitis    Anxiety    Apnea    Bilateral leg edema    Cyst of right kidney    small exophytic cyst, incidental finding along with appendicitis on CT scan   Difficult intubation 10/20/2106   ED (erectile dysfunction)    Fibromyalgia    GERD (gastroesophageal reflux disease)    Hayfever    Hypertension    Lateral epicondylitis    Paronychia    Purulent appendicitis 10/23/2016   Family History  Problem Relation Age of Onset   Dementia Father    Depression Father    Macular degeneration Father    Neuropathy Neg Hx    Past Surgical History:  Procedure Laterality Date   CATARACT EXTRACTION, BILATERAL     COLONOSCOPY  01/2014   normal   INGUINAL HERNIA REPAIR     KNEE ARTHROSCOPY     LAPAROSCOPIC APPENDECTOMY N/A 10/20/2016   Procedure: APPENDECTOMY LAPAROSCOPIC;  Surgeon: Greer Pickerel, MD;  Location: [facility];  Service: General;  Laterality: N/A;   TONSILLECTOMY     Social History   Occupational History   Occupation: Retired   Tobacco Use   Smoking status: Former    Packs/day: 1.00    Years: 15.00    Pack years: 15.00    Types: Cigarettes    Quit date: 1997    Years since quitting: 25.8   Smokeless tobacco: Never  Vaping Use   Vaping Use: Never used  Substance and Sexual Activity   Alcohol use: Yes    Alcohol/week: 14.0 standard drinks    Types: 7 Glasses of wine, 7 Shots of liquor per week    Comment: 1-2 drinks per day either 2 wines or scotch and water before dinner and then wine with dinner    Drug use: No   Sexual activity: Not Currently    Birth control/protection: Abstinence

## 2021-10-11 ENCOUNTER — Encounter: Payer: Self-pay | Admitting: Neurology

## 2021-10-27 ENCOUNTER — Other Ambulatory Visit: Payer: Medicare Other

## 2021-10-30 ENCOUNTER — Ambulatory Visit: Payer: Self-pay

## 2021-10-30 ENCOUNTER — Encounter: Payer: Self-pay | Admitting: Physical Medicine and Rehabilitation

## 2021-10-30 ENCOUNTER — Ambulatory Visit (INDEPENDENT_AMBULATORY_CARE_PROVIDER_SITE_OTHER): Payer: Medicare Other | Admitting: Physical Medicine and Rehabilitation

## 2021-10-30 ENCOUNTER — Other Ambulatory Visit: Payer: Self-pay

## 2021-10-30 VITALS — BP 111/76 | HR 92

## 2021-10-30 DIAGNOSIS — M5416 Radiculopathy, lumbar region: Secondary | ICD-10-CM

## 2021-10-30 DIAGNOSIS — M48061 Spinal stenosis, lumbar region without neurogenic claudication: Secondary | ICD-10-CM

## 2021-10-30 MED ORDER — METHYLPREDNISOLONE ACETATE 80 MG/ML IJ SUSP
80.0000 mg | Freq: Once | INTRAMUSCULAR | Status: AC
  Administered 2021-10-30: 15:00:00 80 mg

## 2021-10-30 NOTE — Progress Notes (Signed)
Pt states he has some pain in LB  Pt states both legs hurt He states he has strength but has a hard time walking due to pain Says no one is giving him a true dx  Numeric Pain Rating Scale and Functional Assessment Average Pain 3   In the last MONTH (on 0-10 scale) has pain interfered with the following?  1. General activity like being  able to carry out your everyday physical activities such as walking, climbing stairs, carrying groceries, or moving a chair?  Rating(6)   +Driver, -BT(baby aspirin), -Dye Allergies.

## 2021-11-11 NOTE — Procedures (Signed)
Lumbosacral Transforaminal Epidural Steroid Injection - Sub-Pedicular Approach with Fluoroscopic Guidance  Patient: Nathan Delgado      Date of Birth: 10-Jul-1941 MRN: [id number] PCP: Leanna Battles, MD      Visit Date: 10/30/2021   Universal Protocol:    Date/Time: 10/30/2021  Consent Given By: the patient  Position: PRONE  Additional Comments: Vital signs were monitored before and after the procedure. Patient was prepped and draped in the usual sterile fashion. The correct patient, procedure, and site was verified.   Injection Procedure Details:   Procedure diagnoses: Lumbar radiculopathy [M54.16]    Meds Administered:  Meds ordered this encounter  Medications   methylPREDNISolone acetate (DEPO-MEDROL) injection 80 mg    Laterality: Bilateral  Location/Site: L4  Needle:5.0 in., 22 ga.  Short bevel or Quincke spinal needle  Needle Placement: Transforaminal  Findings:    -Comments: Excellent flow of contrast along the nerve, nerve root and into the epidural space.  Procedure Details: After squaring off the end-plates to get a true AP view, the C-arm was positioned so that an oblique view of the foramen as noted above was visualized. The target area is just inferior to the "nose of the scotty dog" or sub pedicular. The soft tissues overlying this structure were infiltrated with 2-3 ml. of 1% Lidocaine without Epinephrine.  The spinal needle was inserted toward the target using a "trajectory" view along the fluoroscope beam.  Under AP and lateral visualization, the needle was advanced so it did not puncture dura and was located close the 6 O'Clock position of the pedical in AP tracterory. Biplanar projections were used to confirm position. Aspiration was confirmed to be negative for CSF and/or blood. A 1-2 ml. volume of Isovue-250 was injected and flow of contrast was noted at each level. Radiographs were obtained for documentation purposes.   After attaining the desired  flow of contrast documented above, a 0.5 to 1.0 ml test dose of 0.25% Marcaine was injected into each respective transforaminal space.  The patient was observed for 90 seconds post injection.  After no sensory deficits were reported, and normal lower extremity motor function was noted,   the above injectate was administered so that equal amounts of the injectate were placed at each foramen (level) into the transforaminal epidural space.   Additional Comments:  The patient tolerated the procedure well Dressing: 2 x 2 sterile gauze and Band-Aid    Post-procedure details: Patient was observed during the procedure. Post-procedure instructions were reviewed.  Patient left the clinic in stable condition.

## 2021-11-11 NOTE — Progress Notes (Signed)
Nathan Delgado - 80 y.o. male MRN [id number]  Date of birth: 02/26/1941  Office Visit Note: Visit Date: 10/30/2021 PCP: Leanna Battles, MD Referred by: Leanna Battles, MD  Subjective: Chief Complaint  Patient presents with   Lower Back - Pain   HPI:  KARMELO Delgado is a 80 y.o. male who comes in today at the request of Barnet Pall, FNP for planned Bilateral L4-5 Lumbar Transforaminal epidural steroid injection with fluoroscopic guidance.  The patient has failed conservative care including home exercise, medications, time and activity modification.  This injection will be diagnostic and hopefully therapeutic.  Please see requesting physician notes for further details and justification. MRI reviewed with images and spine model.  MRI reviewed in the note below.   ROS Otherwise per HPI.  Assessment & Plan: Visit Diagnoses:    ICD-10-CM   1. Lumbar radiculopathy  M54.16 XR C-ARM NO REPORT    Epidural Steroid injection    methylPREDNISolone acetate (DEPO-MEDROL) injection 80 mg    2. 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   Meds & Orders:  Meds ordered this encounter  Medications   methylPREDNISolone acetate (DEPO-MEDROL) injection 80 mg    Orders Placed This Encounter  Procedures   XR C-ARM NO REPORT   Epidural Steroid injection    Follow-up: Return if symptoms worsen or fail to improve.   Procedures: No procedures performed  Lumbosacral Transforaminal Epidural Steroid Injection - Sub-Pedicular Approach with Fluoroscopic Guidance  Patient: Nathan Delgado      Date of Birth: 1941/05/18 MRN: [id number] PCP: Leanna Battles, MD      Visit Date: 10/30/2021   Universal Protocol:    Date/Time: 10/30/2021  Consent Given By: the patient  Position: PRONE  Additional Comments: Vital signs were monitored before and after the procedure. Patient was prepped and draped in the usual sterile fashion. The correct patient,  procedure, and site was verified.   Injection Procedure Details:   Procedure diagnoses: Lumbar radiculopathy [M54.16]    Meds Administered:  Meds ordered this encounter  Medications   methylPREDNISolone acetate (DEPO-MEDROL) injection 80 mg    Laterality: Bilateral  Location/Site: L4  Needle:5.0 in., 22 ga.  Short bevel or Quincke spinal needle  Needle Placement: Transforaminal  Findings:    -Comments: Excellent flow of contrast along the nerve, nerve root and into the epidural space.  Procedure Details: After squaring off the end-plates to get a true AP view, the C-arm was positioned so that an oblique view of the foramen as noted above was visualized. The target area is just inferior to the "nose of the scotty dog" or sub pedicular. The soft tissues overlying this structure were infiltrated with 2-3 ml. of 1% Lidocaine without Epinephrine.  The spinal needle was inserted toward the target using a "trajectory" view along the fluoroscope beam.  Under AP and lateral visualization, the needle was advanced so it did not puncture dura and was located close the 6 O'Clock position of the pedical in AP tracterory. Biplanar projections were used to confirm position. Aspiration was confirmed to be negative for CSF and/or blood. A 1-2 ml. volume of Isovue-250 was injected and flow of contrast was noted at each level. Radiographs were obtained for documentation purposes.   After attaining the desired flow of contrast documented above, a 0.5 to 1.0 ml test dose of 0.25% Marcaine was injected into each respective transforaminal space.  The patient was observed for 90  seconds post injection.  After no sensory deficits were reported, and normal lower extremity motor function was noted,   the above injectate was administered so that equal amounts of the injectate were placed at each foramen (level) into the transforaminal epidural space.   Additional Comments:  The patient tolerated the procedure  well Dressing: 2 x 2 sterile gauze and Band-Aid    Post-procedure details: Patient was observed during the procedure. Post-procedure instructions were reviewed.  Patient left the clinic in stable condition.    Clinical History: EXAM: MRI LUMBAR SPINE WITHOUT CONTRAST   TECHNIQUE: Multiplanar, multisequence MR imaging of the lumbar spine was performed. No intravenous contrast was administered.   COMPARISON:  03/11/2018 lumbar spine MRI   FINDINGS: Segmentation:  Normal   Alignment:  Normal   Vertebrae: No acute compression fracture, discitis-osteomyelitis, facet edema or other focal marrow lesion. No epidural collection.   Conus medullaris and cauda equina: Conus extends to the L1 level. Conus and cauda equina appear normal.   Paraspinal and other soft tissues: Negative   Disc levels:   T11-12: Sagittal imaging only. Normal.   T12-L1: Normal disc space and facets. No spinal canal or neuroforaminal stenosis.   L1-L2: Normal disc space and facets. No spinal canal or neuroforaminal stenosis.   L2-L3: Mild circumferential disc bulge and mild facet hypertrophy. No spinal canal or neural foraminal stenosis.   L3-L4: Right asymmetric disc bulge, unchanged. No spinal canal stenosis. Unchanged mild right foraminal stenosis.   L4-L5: Left asymmetric disc bulge. No spinal canal stenosis. Mild right and moderate left neural foraminal stenosis.   L5-S1: Mild disc desiccation. No spinal canal or neural foraminal stenosis. Moderate facet hypertrophy.   Visualized sacrum: Normal.   IMPRESSION: 1. Unchanged examination with moderate left L4-L5 neural foraminal stenosis. 2. No spinal canal stenosis.     Electronically Signed   By: Ulyses Jarred M.D.   On: 10/24/2019 02:07     Objective:  VS:  HT:     WT:    BMI:      BP:111/76   HR:92bpm   TEMP: ( )   RESP:  Physical Exam Vitals and nursing note reviewed.  Constitutional:      General: He is not in acute  distress.    Appearance: Normal appearance. He is not ill-appearing.  HENT:     Head: Normocephalic and atraumatic.     Right Ear: External ear normal.     Left Ear: External ear normal.     Nose: No congestion.  Eyes:     Extraocular Movements: Extraocular movements intact.  Cardiovascular:     Rate and Rhythm: Normal rate.     Pulses: Normal pulses.  Pulmonary:     Effort: Pulmonary effort is normal. No respiratory distress.  Abdominal:     General: There is no distension.     Palpations: Abdomen is soft.  Musculoskeletal:        General: No tenderness or signs of injury.     Cervical back: Neck supple.     Right lower leg: No edema.     Left lower leg: No edema.     Comments: Patient has good distal strength without clonus.  Skin:    Findings: No erythema or rash.  Neurological:     General: No focal deficit present.     Mental Status: He is alert and oriented to person, place, and time.     Sensory: No sensory deficit.     Motor: No weakness  or abnormal muscle tone.     Coordination: Coordination normal.  Psychiatric:        Mood and Affect: Mood normal.        Behavior: Behavior normal.     Imaging: No results found.

## 2022-01-01 DIAGNOSIS — G4733 Obstructive sleep apnea (adult) (pediatric): Secondary | ICD-10-CM | POA: Diagnosis not present

## 2022-01-01 DIAGNOSIS — G609 Hereditary and idiopathic neuropathy, unspecified: Secondary | ICD-10-CM | POA: Diagnosis not present

## 2022-01-01 DIAGNOSIS — G20 Parkinson's disease: Secondary | ICD-10-CM | POA: Diagnosis not present

## 2022-01-01 DIAGNOSIS — I10 Essential (primary) hypertension: Secondary | ICD-10-CM | POA: Diagnosis not present

## 2022-01-01 DIAGNOSIS — I739 Peripheral vascular disease, unspecified: Secondary | ICD-10-CM | POA: Diagnosis not present

## 2022-01-17 ENCOUNTER — Encounter: Payer: Self-pay | Admitting: Neurology

## 2022-01-28 NOTE — Progress Notes (Signed)
? ? ?Assessment/Plan:  ? ?1.  Parkinsonism ? -We reviewed his case in detail and all of the testing that we have gone through for the last several years. ? -DaTscan done in 2019 demonstrating decreased radiotracer in the bilateral striata.  DaTscan, however, was done on Wellbutrin.  Discussed the false positives that can present. ? -Patient does not meet clinical criteria for the diagnosis of Parkinson's disease. ? -High-volume lumbar puncture was completed and was negative.  Reviewed his CT and MRIs today with him. ? -Levodopa challenge test in June, 2021 demonstrated no benefit to levodopa.  Levodopa was discontinued. ? 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  His most recent MRI could suggest FTD, but clinically he really does not look like an FTD patient.  Discussed with the patient that we could do neurocognitive testing, which could help this.  Potentially, the newer skin biopsy for alpha-synuclein could help, but my guess is that his would be just potentially positive, although there are some differences in patients who have FTD versus MSA versus Parkinson's disease.  Regardless of any of this, I told the patient that the treatment may continue to just be supportive.  After patient talked with his trainer, they felt that they would like to proceed with the skin biopsy and see what type of answers that would give them.  We will try to get insurance prior authorization. ? ?2.  Leg pain ? -continues to be 1 of his biggest complaint.  EMG was negative.  This does not exclude a small fiber neuropathy and we discussed this today, but that diagnosis would require biopsy.  He has had an extensive work-up that has been negative thus far. ? ? ?3.  I will see him at his regular follow-up in 1 year, but I plan to hopefully get a biopsy done before that time. ? ? ?Subjective:  ? ?Nathan Delgado  was seen today in follow up for parkinsonism.  My previous records were reviewed prior to todays visit as well as outside records available to me.  Previously, levodopa challenge test was completed and demonstrated no benefit to levodopa.  Levodopa was discontinued.  He noticed no difference on versus off levodopa.  His biggest complaint is continued to be leg pain.  EMG previously completed was negative.  He has been following with orthopedics and has had injections into the back, which have not provided adequate relief.  He also had an MRI brain done since last visit which was personally reviewed.  He continues to have large ventricles, unchanged.  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  No falls.  No hallucinations.  Some trouble sleeping.  Goes to [facility] 3 days per week.  No lightheadednes.  No diplopia.   ? ?Current prescribed movement disorder medications: ?None ? ? ?PREVIOUS MEDICATIONS: Sinemet ? ?ALLERGIES:  No Known Allergies ? ?CURRENT MEDICATIONS:  ?Outpatient Encounter Medications as of 01/29/2022  ?Medication Sig  ? acetaminophen (TYLENOL) 325 MG tablet You can take up to 4000 mg of this per day. (Patient taking differently: as needed. You can take up to 4000 mg of this per day.)  ? aspirin 81 MG chewable tablet Chew by mouth.  ? co-enzyme Q-10 30 MG capsule Take 30 mg by mouth daily.  ? Fish Oil-Cholecalciferol (OMEGA-3 + VITAMIN D3 PO) Take 1 capsule by mouth daily.   ? losartan-hydrochlorothiazide (HYZAAR) 100-12.5 MG tablet  Take 1 tablet by mouth daily.   ? MAGNESIUM PO Take 1 capsule by mouth daily.   ? Multiple Vitamin (MULTIVITAMIN) tablet Take 1 tablet by mouth daily.  ? NON FORMULARY CBD OIL CREAM BILATERAL LEGS AND NECK DAILY  ? NON FORMULARY Take 1 drop by mouth daily. CBD oil  ? nortriptyline (PAMELOR) 75 MG capsule Take 1 capsule by mouth daily.  ? omeprazole (PRILOSEC) 20 MG capsule Take by mouth 2 (two) times daily before a meal.   ? ?No facility-administered encounter medications on file as  of 01/29/2022.  ? ? ?Objective:  ? ?PHYSICAL EXAMINATION:   ? ?VITALS:   ?Vitals:  ? 01/29/22 1510  ?BP: 122/83  ?Pulse: 79  ?SpO2: 96%  ?Weight: 234 lb 6.4 oz (106.3 kg)  ?Height: '5\' 10"'$  (1.778 m)  ? ? ? ?GEN:  The patient appears stated age and is in NAD. ?HEENT:  Normocephalic, atraumatic.  The mucous membranes are moist. The superficial temporal arteries are without ropiness or tenderness. ? ? ? ? ?Neurological examination: ? ?Orientation: The patient is alert and oriented x3. ?Cranial nerves: There is good facial symmetry with facial hypomimia. The speech is fluent and clear. Soft palate rises symmetrically and there is no tongue deviation. Hearing is intact to conversational tone. ?Sensation: Sensation is intact to light touch throughout ?Motor: Strength is at least antigravity x4. ? ?Movement examination: ?Tone: There is normal tone in the UE/LE ?Abnormal movements: there is no tremor ?Coordination:  There is no decremation with RAM's, with any form of RAMS, including alternating supination and pronation of the forearm, hand opening and closing, finger taps, heel taps and toe taps. ?Gait and Station: The patient has no difficulty arising out of a deep-seated chair without the use of the hands.  He is given a walker.  He is flexed at the waist.  He is short stepped, but is actually able to walk fairly well with a walker.  He thinks about it without the walker, he walks fairly well, but when he does not think about it, he gets short stepped. ? ?I have reviewed and interpreted the following labs independently ? ?  Chemistry   ?   ?Component Value Date/Time  ? NA 143 06/25/2018 1518  ? K 4.3 06/25/2018 1518  ? CL 101 06/25/2018 1518  ? CO2 28 06/25/2018 1518  ? BUN 16 06/25/2018 1518  ? CREATININE 0.99 06/25/2018 1518  ?    ?Component Value Date/Time  ? CALCIUM 9.8 06/25/2018 1518  ? ALKPHOS 50 06/25/2018 1518  ? AST 31 06/25/2018 1518  ? ALT 41 06/25/2018 1518  ? BILITOT 0.5 06/25/2018 1518  ?  ? ? ? ?Lab Results   ?Component Value Date  ? WBC 5.7 06/25/2018  ? HGB 15.5 06/25/2018  ? HCT 48.4 06/25/2018  ? MCV 91 06/25/2018  ? PLT 215 06/25/2018  ? ? ?Lab Results  ?Component Value Date  ? TSH 1.760 06/25/2018  ? ? ? ?Total time spent on today's visit was 48 minutes, including both face-to-face time and nonface-to-face time.  Time included that spent on review of records (prior notes available to me/labs/imaging if pertinent), discussing treatment and goals, answering patient's questions and coordinating care. ? ?Cc:  Donnajean Lopes, MD ?

## 2022-01-29 ENCOUNTER — Ambulatory Visit (INDEPENDENT_AMBULATORY_CARE_PROVIDER_SITE_OTHER): Payer: Medicare Other | Admitting: Neurology

## 2022-01-29 ENCOUNTER — Encounter: Payer: Self-pay | Admitting: Neurology

## 2022-01-29 ENCOUNTER — Other Ambulatory Visit: Payer: Self-pay

## 2022-01-29 VITALS — BP 122/83 | HR 79 | Ht 70.0 in | Wt 234.4 lb

## 2022-01-29 DIAGNOSIS — G20 Parkinson's disease: Secondary | ICD-10-CM | POA: Diagnosis not present

## 2022-02-13 ENCOUNTER — Encounter: Payer: Self-pay | Admitting: Neurology

## 2022-02-25 DIAGNOSIS — H02135 Senile ectropion of left lower eyelid: Secondary | ICD-10-CM | POA: Diagnosis not present

## 2022-02-25 DIAGNOSIS — H16223 Keratoconjunctivitis sicca, not specified as Sjogren's, bilateral: Secondary | ICD-10-CM | POA: Diagnosis not present

## 2022-02-25 DIAGNOSIS — H02132 Senile ectropion of right lower eyelid: Secondary | ICD-10-CM | POA: Diagnosis not present

## 2022-03-14 ENCOUNTER — Encounter: Payer: Self-pay | Admitting: Neurology

## 2022-03-14 ENCOUNTER — Ambulatory Visit (INDEPENDENT_AMBULATORY_CARE_PROVIDER_SITE_OTHER): Payer: Medicare Other | Admitting: Neurology

## 2022-03-14 DIAGNOSIS — R258 Other abnormal involuntary movements: Secondary | ICD-10-CM | POA: Diagnosis not present

## 2022-03-14 DIAGNOSIS — G20 Parkinson's disease: Secondary | ICD-10-CM

## 2022-03-14 NOTE — Procedures (Signed)
Punch Biopsy Procedure Note ? ?Preprocedure Diagnosis: Bradykinesia; ? ?Postprocedure Diagnosis: same ? ?Locations: ?Site 1: left shoulder;  ?Site 2: above knee, left ?Site 3: above, left  ankle ? ?Indications: r/o alpha synucleinopathy ? ?Anesthesia: 4 mL Lidocaine 1% with epinephrine  spread evenly over the 3 biopsy sites ? ?Procedure Details ?Patient informed of the risks (including but not limited to bleeding, pain, infection, scar and infection) and benefits of the procedure.  Informed consent obtained. ? ?The lesion and surrounding area was given a sterile prep using alcohol and draped in the usual sterile fashion. The skin was then stretched perpendicular to the skin tension lines and the lesion removed using the 3 mm punch. Pressure applied and dressing applied.  Antibiotic ointment and a sterile dressing applied. The specimen was sent for pathologic examination. The patient tolerated the procedure well. ? ?Estimated Blood Loss: none ? ?Condition: ?Stable ? ?Complications: ?none. ? ?Plan: ?1. Instructed to keep the wound dry and covered for 24-48h and clean thereafter. ?2. Warning signs of infection were reviewed.  ? ? ?

## 2022-04-05 ENCOUNTER — Telehealth: Payer: Self-pay | Admitting: Neurology

## 2022-04-05 NOTE — Telephone Encounter (Signed)
Received results of patient's skin biopsy.  There was no evidence of alpha-synuclein within the cutaneous nerves.  There was no evidence of small fiber neuropathy.  There was no evidence of amyloid deposition within the cutaneous nerves either. ? ?Chelsea, you do not have to give him the results of the above, but please make him an appointment for next Thursday over video just to review the results. ?

## 2022-04-05 NOTE — Telephone Encounter (Signed)
Called patient and also sent a my chart message about the virtual appointment next Thursday. I have also made the front desk staff aware in case he calls back to please put him on the schedule  for a virtual visit next Thursday for going over test results  ?

## 2022-04-09 NOTE — Telephone Encounter (Signed)
Make sure pt aware of video appt ?

## 2022-04-09 NOTE — Telephone Encounter (Signed)
Left patient to remind him his appt was a Virtual visit with Dr Tat and we need a email address or a telephone so that we can send the link out to him.  ?

## 2022-04-10 NOTE — Progress Notes (Signed)
Virtual Visit Via Video/Phone   The purpose of this virtual visit is to provide medical care while limiting exposure to the novel coronavirus.    Consent was obtained for phone visit:  Yes.   Answered questions that patient had about telehealth interaction:  Yes.   I discussed the limitations, risks, security and privacy concerns of performing an evaluation and management service by telephone (spent 20 minutes attempting video and patient was not able to get on). I also discussed with the patient that there may be a patient responsible charge related to this service. The patient expressed understanding and agreed to proceed.  Pt location: Home Physician Location: office Name of referring provider:  Donnajean Lopes, MD I connected with Nathan Delgado at patients initiation/request on 04/11/2022 at  3:00 PM EDT by telephone and verified that I am speaking with the correct person using two identifiers. Pt MRN:  [id number] Pt DOB:  Oct 29, 1941 Video Participants:  Nathan Delgado;    Assessment/Plan:   1.  Parkinsonism  -We reviewed his case in detail and all of the testing that we have gone through for the last several years.  -DaTscan done in 2019 demonstrating decreased radiotracer in the bilateral striata.  DaTscan, however, was done on Wellbutrin.  Discussed the false positives that can present.  -Patient does not meet clinical criteria for the diagnosis of Parkinson's disease.  -Skin biopsy just done for alpha-synuclein was negative.   Discussed that they have about 95-96% sensitivity and specificity.    -Copy of skin biopsy sent to her primary care physician, at patient request.  -High-volume lumbar puncture was completed and was negative.    -Levodopa challenge test in June, 2021 demonstrated no benefit to levodopa.  Levodopa was discontinued.  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  He has not been  levodopa responsive.    His most recent MRI could suggest FTD, but clinically he really does not look like an FTD patient.  From my standpoint, I am not sure that there is really much more to pursue.  He really has not deteriorated over the years, and the biggest issues continue to be with ambulation and shuffling.  He is already exercising significantly. 2.  Leg pain  -continues to be 1 of his biggest complaint.  EMG was negative for large fiber neuropathy.  Skin biopsy did not demonstrate evidence of a small fiber neuropathy.  There was also no evidence of amyloid.   3.  He will follow-up with me on an as-needed basis.   Subjective:   Nathan Delgado was seen today in follow up for test results.  We recently completed a skin biopsy for alpha-synuclein, that just became commercially available.  That skin biopsy was negative, and did not demonstrate evidence of alpha-synuclein within the 3 biopsy sites that were taken.  Likewise, skin biopsy did not demonstrate any evidence of a small fiber neuropathy or amyloid deposition within the cutaneous nerves.  Pt reports "I am strong.  I feel great.  I just dont walk great."    Current prescribed movement disorder medications: None   PREVIOUS MEDICATIONS: Sinemet  ALLERGIES:  No Known Allergies  CURRENT MEDICATIONS:  Outpatient Encounter Medications as of 04/11/2022  Medication Sig   acetaminophen (TYLENOL) 325 MG tablet You can take up to 4000 mg of this per day. (Patient taking differently: as needed. You can take up to 4000 mg of this per day.)  aspirin 81 MG chewable tablet Chew by mouth.   co-enzyme Q-10 30 MG capsule Take 30 mg by mouth daily.   Fish Oil-Cholecalciferol (OMEGA-3 + VITAMIN D3 PO) Take 1 capsule by mouth daily.    losartan-hydrochlorothiazide (HYZAAR) 100-12.5 MG tablet Take 1 tablet by mouth daily.    MAGNESIUM PO Take 1 capsule by mouth daily.    Multiple Vitamin (MULTIVITAMIN) tablet Take 1 tablet by mouth daily.   NON  FORMULARY CBD OIL CREAM BILATERAL LEGS AND NECK DAILY   NON FORMULARY Take 1 drop by mouth daily. CBD oil   nortriptyline (PAMELOR) 75 MG capsule Take 1 capsule by mouth daily.   omeprazole (PRILOSEC) 20 MG capsule Take by mouth 2 (two) times daily before a meal.    No facility-administered encounter medications on file as of 04/11/2022.    Objective:   PHYSICAL EXAMINATION:    VITALS:   There were no vitals filed for this visit.  Patient was alert and oriented x3.  He was unable to get on video.  This was done by phone.    Chemistry      Component Value Date/Time   NA 143 06/25/2018 1518   K 4.3 06/25/2018 1518   CL 101 06/25/2018 1518   CO2 28 06/25/2018 1518   BUN 16 06/25/2018 1518   CREATININE 0.99 06/25/2018 1518      Component Value Date/Time   CALCIUM 9.8 06/25/2018 1518   ALKPHOS 50 06/25/2018 1518   AST 31 06/25/2018 1518   ALT 41 06/25/2018 1518   BILITOT 0.5 06/25/2018 1518       Lab Results  Component Value Date   WBC 5.7 06/25/2018   HGB 15.5 06/25/2018   HCT 48.4 06/25/2018   MCV 91 06/25/2018   PLT 215 06/25/2018    Lab Results  Component Value Date   TSH 1.760 06/25/2018   Follow up Instructions      -I discussed the assessment and treatment plan with the patient. The patient was provided an opportunity to ask questions and all were answered. The patient agreed with the plan and demonstrated an understanding of the instructions.   The patient was advised to call back or seek an in-person evaluation if the symptoms worsen or if the condition fails to improve as anticipated.    Total time spent on today's visit was 60mnutes, including both face-to-face time and nonface-to-face time.  Time included that spent on review of records (prior notes available to me/labs/imaging if pertinent), discussing treatment and goals, answering patient's questions and coordinating care.   RAlonza Bogus DO   Cc:  PDonnajean Lopes MD

## 2022-04-11 ENCOUNTER — Telehealth (INDEPENDENT_AMBULATORY_CARE_PROVIDER_SITE_OTHER): Payer: Medicare Other | Admitting: Neurology

## 2022-04-11 ENCOUNTER — Encounter: Payer: Self-pay | Admitting: Neurology

## 2022-04-11 VITALS — Ht 70.0 in | Wt 228.0 lb

## 2022-04-11 DIAGNOSIS — G214 Vascular parkinsonism: Secondary | ICD-10-CM

## 2022-04-30 DIAGNOSIS — F419 Anxiety disorder, unspecified: Secondary | ICD-10-CM | POA: Diagnosis not present

## 2022-04-30 DIAGNOSIS — I10 Essential (primary) hypertension: Secondary | ICD-10-CM | POA: Diagnosis not present

## 2022-04-30 DIAGNOSIS — Z125 Encounter for screening for malignant neoplasm of prostate: Secondary | ICD-10-CM | POA: Diagnosis not present

## 2022-04-30 DIAGNOSIS — E785 Hyperlipidemia, unspecified: Secondary | ICD-10-CM | POA: Diagnosis not present

## 2022-05-07 DIAGNOSIS — G4733 Obstructive sleep apnea (adult) (pediatric): Secondary | ICD-10-CM | POA: Diagnosis not present

## 2022-05-07 DIAGNOSIS — I10 Essential (primary) hypertension: Secondary | ICD-10-CM | POA: Diagnosis not present

## 2022-05-07 DIAGNOSIS — Z0000 Encounter for general adult medical examination without abnormal findings: Secondary | ICD-10-CM | POA: Diagnosis not present

## 2022-05-07 DIAGNOSIS — G609 Hereditary and idiopathic neuropathy, unspecified: Secondary | ICD-10-CM | POA: Diagnosis not present

## 2022-05-07 DIAGNOSIS — E785 Hyperlipidemia, unspecified: Secondary | ICD-10-CM | POA: Diagnosis not present

## 2022-05-07 DIAGNOSIS — I739 Peripheral vascular disease, unspecified: Secondary | ICD-10-CM | POA: Diagnosis not present

## 2022-05-07 DIAGNOSIS — F5104 Psychophysiologic insomnia: Secondary | ICD-10-CM | POA: Diagnosis not present

## 2022-05-07 DIAGNOSIS — Z1331 Encounter for screening for depression: Secondary | ICD-10-CM | POA: Diagnosis not present

## 2022-05-07 DIAGNOSIS — Z1339 Encounter for screening examination for other mental health and behavioral disorders: Secondary | ICD-10-CM | POA: Diagnosis not present

## 2022-06-11 DIAGNOSIS — R1031 Right lower quadrant pain: Secondary | ICD-10-CM | POA: Diagnosis not present

## 2022-07-01 DIAGNOSIS — R1031 Right lower quadrant pain: Secondary | ICD-10-CM | POA: Diagnosis not present

## 2022-08-12 DIAGNOSIS — Z23 Encounter for immunization: Secondary | ICD-10-CM | POA: Diagnosis not present

## 2022-09-02 DIAGNOSIS — Z23 Encounter for immunization: Secondary | ICD-10-CM | POA: Diagnosis not present

## 2022-12-05 DIAGNOSIS — G4733 Obstructive sleep apnea (adult) (pediatric): Secondary | ICD-10-CM | POA: Diagnosis not present

## 2022-12-05 DIAGNOSIS — M79604 Pain in right leg: Secondary | ICD-10-CM | POA: Diagnosis not present

## 2022-12-05 DIAGNOSIS — I739 Peripheral vascular disease, unspecified: Secondary | ICD-10-CM | POA: Diagnosis not present

## 2022-12-05 DIAGNOSIS — G20B1 Parkinson's disease with dyskinesia, without mention of fluctuations: Secondary | ICD-10-CM | POA: Diagnosis not present

## 2022-12-05 DIAGNOSIS — G609 Hereditary and idiopathic neuropathy, unspecified: Secondary | ICD-10-CM | POA: Diagnosis not present

## 2022-12-05 DIAGNOSIS — E785 Hyperlipidemia, unspecified: Secondary | ICD-10-CM | POA: Diagnosis not present

## 2022-12-05 DIAGNOSIS — I10 Essential (primary) hypertension: Secondary | ICD-10-CM | POA: Diagnosis not present

## 2022-12-30 DIAGNOSIS — G4733 Obstructive sleep apnea (adult) (pediatric): Secondary | ICD-10-CM | POA: Diagnosis not present

## 2023-01-28 DIAGNOSIS — G4733 Obstructive sleep apnea (adult) (pediatric): Secondary | ICD-10-CM | POA: Diagnosis not present

## 2023-02-03 DIAGNOSIS — R200 Anesthesia of skin: Secondary | ICD-10-CM | POA: Diagnosis not present

## 2023-02-03 DIAGNOSIS — G609 Hereditary and idiopathic neuropathy, unspecified: Secondary | ICD-10-CM | POA: Diagnosis not present

## 2023-02-03 DIAGNOSIS — E785 Hyperlipidemia, unspecified: Secondary | ICD-10-CM | POA: Diagnosis not present

## 2023-02-03 DIAGNOSIS — I10 Essential (primary) hypertension: Secondary | ICD-10-CM | POA: Diagnosis not present

## 2023-02-03 DIAGNOSIS — N281 Cyst of kidney, acquired: Secondary | ICD-10-CM | POA: Diagnosis not present

## 2023-02-03 DIAGNOSIS — G319 Degenerative disease of nervous system, unspecified: Secondary | ICD-10-CM | POA: Diagnosis not present

## 2023-02-03 DIAGNOSIS — R270 Ataxia, unspecified: Secondary | ICD-10-CM | POA: Diagnosis not present

## 2023-02-03 DIAGNOSIS — G4733 Obstructive sleep apnea (adult) (pediatric): Secondary | ICD-10-CM | POA: Diagnosis not present

## 2023-02-03 DIAGNOSIS — R2681 Unsteadiness on feet: Secondary | ICD-10-CM | POA: Diagnosis not present

## 2023-02-03 DIAGNOSIS — M5416 Radiculopathy, lumbar region: Secondary | ICD-10-CM | POA: Diagnosis not present

## 2023-02-04 ENCOUNTER — Other Ambulatory Visit: Payer: Self-pay | Admitting: Internal Medicine

## 2023-02-04 DIAGNOSIS — N281 Cyst of kidney, acquired: Secondary | ICD-10-CM

## 2023-02-28 ENCOUNTER — Ambulatory Visit
Admission: RE | Admit: 2023-02-28 | Discharge: 2023-02-28 | Disposition: A | Discharge status: Home / Self Care | Payer: Medicare HMO | Source: Ambulatory Visit | Attending: Internal Medicine | Admitting: Internal Medicine

## 2023-02-28 ENCOUNTER — Other Ambulatory Visit: Payer: Self-pay | Admitting: Internal Medicine

## 2023-02-28 DIAGNOSIS — G4733 Obstructive sleep apnea (adult) (pediatric): Secondary | ICD-10-CM | POA: Diagnosis not present

## 2023-02-28 DIAGNOSIS — N281 Cyst of kidney, acquired: Secondary | ICD-10-CM

## 2023-03-30 DIAGNOSIS — G4733 Obstructive sleep apnea (adult) (pediatric): Secondary | ICD-10-CM | POA: Diagnosis not present

## 2023-04-30 DIAGNOSIS — G4733 Obstructive sleep apnea (adult) (pediatric): Secondary | ICD-10-CM | POA: Diagnosis not present

## 2023-05-05 DIAGNOSIS — R270 Ataxia, unspecified: Secondary | ICD-10-CM | POA: Diagnosis not present

## 2023-05-05 DIAGNOSIS — M25561 Pain in right knee: Secondary | ICD-10-CM | POA: Diagnosis not present

## 2023-05-05 DIAGNOSIS — I10 Essential (primary) hypertension: Secondary | ICD-10-CM | POA: Diagnosis not present

## 2023-05-05 DIAGNOSIS — M25461 Effusion, right knee: Secondary | ICD-10-CM | POA: Diagnosis not present

## 2023-05-30 DIAGNOSIS — G4733 Obstructive sleep apnea (adult) (pediatric): Secondary | ICD-10-CM | POA: Diagnosis not present

## 2023-06-25 DIAGNOSIS — H43813 Vitreous degeneration, bilateral: Secondary | ICD-10-CM | POA: Diagnosis not present

## 2023-06-25 DIAGNOSIS — H16223 Keratoconjunctivitis sicca, not specified as Sjogren's, bilateral: Secondary | ICD-10-CM | POA: Diagnosis not present

## 2023-06-25 DIAGNOSIS — H02135 Senile ectropion of left lower eyelid: Secondary | ICD-10-CM | POA: Diagnosis not present

## 2023-06-25 DIAGNOSIS — H02132 Senile ectropion of right lower eyelid: Secondary | ICD-10-CM | POA: Diagnosis not present

## 2023-06-26 DIAGNOSIS — Z0100 Encounter for examination of eyes and vision without abnormal findings: Secondary | ICD-10-CM | POA: Diagnosis not present

## 2023-06-30 DIAGNOSIS — G4733 Obstructive sleep apnea (adult) (pediatric): Secondary | ICD-10-CM | POA: Diagnosis not present

## 2023-07-17 DIAGNOSIS — I10 Essential (primary) hypertension: Secondary | ICD-10-CM | POA: Diagnosis not present

## 2023-07-17 DIAGNOSIS — E785 Hyperlipidemia, unspecified: Secondary | ICD-10-CM | POA: Diagnosis not present

## 2023-07-17 DIAGNOSIS — Z125 Encounter for screening for malignant neoplasm of prostate: Secondary | ICD-10-CM | POA: Diagnosis not present

## 2023-07-31 DIAGNOSIS — G4733 Obstructive sleep apnea (adult) (pediatric): Secondary | ICD-10-CM | POA: Diagnosis not present

## 2023-08-12 DIAGNOSIS — E785 Hyperlipidemia, unspecified: Secondary | ICD-10-CM | POA: Diagnosis not present

## 2023-08-12 DIAGNOSIS — Z1331 Encounter for screening for depression: Secondary | ICD-10-CM | POA: Diagnosis not present

## 2023-08-12 DIAGNOSIS — R2681 Unsteadiness on feet: Secondary | ICD-10-CM | POA: Diagnosis not present

## 2023-08-12 DIAGNOSIS — G4733 Obstructive sleep apnea (adult) (pediatric): Secondary | ICD-10-CM | POA: Diagnosis not present

## 2023-08-12 DIAGNOSIS — R270 Ataxia, unspecified: Secondary | ICD-10-CM | POA: Diagnosis not present

## 2023-08-12 DIAGNOSIS — I10 Essential (primary) hypertension: Secondary | ICD-10-CM | POA: Diagnosis not present

## 2023-08-12 DIAGNOSIS — Z1339 Encounter for screening examination for other mental health and behavioral disorders: Secondary | ICD-10-CM | POA: Diagnosis not present

## 2023-08-12 DIAGNOSIS — R82998 Other abnormal findings in urine: Secondary | ICD-10-CM | POA: Diagnosis not present

## 2023-08-12 DIAGNOSIS — Z23 Encounter for immunization: Secondary | ICD-10-CM | POA: Diagnosis not present

## 2023-08-12 DIAGNOSIS — Z0000 Encounter for general adult medical examination without abnormal findings: Secondary | ICD-10-CM | POA: Diagnosis not present

## 2023-08-12 DIAGNOSIS — I739 Peripheral vascular disease, unspecified: Secondary | ICD-10-CM | POA: Diagnosis not present

## 2023-08-12 DIAGNOSIS — G609 Hereditary and idiopathic neuropathy, unspecified: Secondary | ICD-10-CM | POA: Diagnosis not present

## 2023-08-30 DIAGNOSIS — G4733 Obstructive sleep apnea (adult) (pediatric): Secondary | ICD-10-CM | POA: Diagnosis not present

## 2023-09-24 DIAGNOSIS — R69 Illness, unspecified: Secondary | ICD-10-CM | POA: Diagnosis not present

## 2023-09-30 DIAGNOSIS — G4733 Obstructive sleep apnea (adult) (pediatric): Secondary | ICD-10-CM | POA: Diagnosis not present

## 2023-10-02 DIAGNOSIS — G4733 Obstructive sleep apnea (adult) (pediatric): Secondary | ICD-10-CM | POA: Diagnosis not present

## 2023-10-30 DIAGNOSIS — G4733 Obstructive sleep apnea (adult) (pediatric): Secondary | ICD-10-CM | POA: Diagnosis not present

## 2023-11-11 DIAGNOSIS — G609 Hereditary and idiopathic neuropathy, unspecified: Secondary | ICD-10-CM | POA: Diagnosis not present

## 2023-11-11 DIAGNOSIS — I10 Essential (primary) hypertension: Secondary | ICD-10-CM | POA: Diagnosis not present

## 2023-11-11 DIAGNOSIS — M25552 Pain in left hip: Secondary | ICD-10-CM | POA: Diagnosis not present

## 2023-11-11 DIAGNOSIS — R270 Ataxia, unspecified: Secondary | ICD-10-CM | POA: Diagnosis not present

## 2023-11-11 DIAGNOSIS — R2681 Unsteadiness on feet: Secondary | ICD-10-CM | POA: Diagnosis not present

## 2023-12-18 DIAGNOSIS — R2681 Unsteadiness on feet: Secondary | ICD-10-CM | POA: Diagnosis not present

## 2023-12-18 DIAGNOSIS — I739 Peripheral vascular disease, unspecified: Secondary | ICD-10-CM | POA: Diagnosis not present

## 2023-12-18 DIAGNOSIS — M7918 Myalgia, other site: Secondary | ICD-10-CM | POA: Diagnosis not present

## 2023-12-18 DIAGNOSIS — G4733 Obstructive sleep apnea (adult) (pediatric): Secondary | ICD-10-CM | POA: Diagnosis not present

## 2023-12-18 DIAGNOSIS — R270 Ataxia, unspecified: Secondary | ICD-10-CM | POA: Diagnosis not present

## 2023-12-18 DIAGNOSIS — I10 Essential (primary) hypertension: Secondary | ICD-10-CM | POA: Diagnosis not present

## 2023-12-18 DIAGNOSIS — G609 Hereditary and idiopathic neuropathy, unspecified: Secondary | ICD-10-CM | POA: Diagnosis not present

## 2024-03-23 DIAGNOSIS — G319 Degenerative disease of nervous system, unspecified: Secondary | ICD-10-CM | POA: Diagnosis not present

## 2024-03-23 DIAGNOSIS — Z6835 Body mass index (BMI) 35.0-35.9, adult: Secondary | ICD-10-CM | POA: Diagnosis not present

## 2024-03-23 DIAGNOSIS — R270 Ataxia, unspecified: Secondary | ICD-10-CM | POA: Diagnosis not present

## 2024-03-23 DIAGNOSIS — I739 Peripheral vascular disease, unspecified: Secondary | ICD-10-CM | POA: Diagnosis not present

## 2024-03-23 DIAGNOSIS — E66812 Obesity, class 2: Secondary | ICD-10-CM | POA: Diagnosis not present

## 2024-03-23 DIAGNOSIS — I10 Essential (primary) hypertension: Secondary | ICD-10-CM | POA: Diagnosis not present

## 2024-03-23 DIAGNOSIS — G4733 Obstructive sleep apnea (adult) (pediatric): Secondary | ICD-10-CM | POA: Diagnosis not present

## 2024-03-23 DIAGNOSIS — G609 Hereditary and idiopathic neuropathy, unspecified: Secondary | ICD-10-CM | POA: Diagnosis not present

## 2024-03-31 DIAGNOSIS — H0288B Meibomian gland dysfunction left eye, upper and lower eyelids: Secondary | ICD-10-CM | POA: Diagnosis not present

## 2024-03-31 DIAGNOSIS — H04123 Dry eye syndrome of bilateral lacrimal glands: Secondary | ICD-10-CM | POA: Diagnosis not present

## 2024-03-31 DIAGNOSIS — H3561 Retinal hemorrhage, right eye: Secondary | ICD-10-CM | POA: Diagnosis not present

## 2024-03-31 DIAGNOSIS — H0288A Meibomian gland dysfunction right eye, upper and lower eyelids: Secondary | ICD-10-CM | POA: Diagnosis not present

## 2024-05-03 DIAGNOSIS — G4733 Obstructive sleep apnea (adult) (pediatric): Secondary | ICD-10-CM | POA: Diagnosis not present

## 2024-06-02 DIAGNOSIS — G4733 Obstructive sleep apnea (adult) (pediatric): Secondary | ICD-10-CM | POA: Diagnosis not present

## 2024-07-12 DIAGNOSIS — H903 Sensorineural hearing loss, bilateral: Secondary | ICD-10-CM | POA: Diagnosis not present

## 2024-08-03 DIAGNOSIS — G4733 Obstructive sleep apnea (adult) (pediatric): Secondary | ICD-10-CM | POA: Diagnosis not present

## 2024-08-23 DIAGNOSIS — Z125 Encounter for screening for malignant neoplasm of prostate: Secondary | ICD-10-CM | POA: Diagnosis not present

## 2024-08-23 DIAGNOSIS — I10 Essential (primary) hypertension: Secondary | ICD-10-CM | POA: Diagnosis not present

## 2024-08-23 DIAGNOSIS — Z79899 Other long term (current) drug therapy: Secondary | ICD-10-CM | POA: Diagnosis not present

## 2024-08-30 DIAGNOSIS — R82998 Other abnormal findings in urine: Secondary | ICD-10-CM | POA: Diagnosis not present

## 2024-09-30 DIAGNOSIS — H04123 Dry eye syndrome of bilateral lacrimal glands: Secondary | ICD-10-CM | POA: Diagnosis not present

## 2024-09-30 DIAGNOSIS — H43813 Vitreous degeneration, bilateral: Secondary | ICD-10-CM | POA: Diagnosis not present

## 2024-10-08 DIAGNOSIS — F419 Anxiety disorder, unspecified: Secondary | ICD-10-CM | POA: Diagnosis not present

## 2024-10-08 DIAGNOSIS — G609 Hereditary and idiopathic neuropathy, unspecified: Secondary | ICD-10-CM | POA: Diagnosis not present

## 2024-10-08 DIAGNOSIS — R000 Tachycardia, unspecified: Secondary | ICD-10-CM | POA: Diagnosis not present

## 2024-10-08 DIAGNOSIS — G4733 Obstructive sleep apnea (adult) (pediatric): Secondary | ICD-10-CM | POA: Diagnosis not present

## 2024-10-08 DIAGNOSIS — G20A1 Parkinson's disease without dyskinesia, without mention of fluctuations: Secondary | ICD-10-CM | POA: Diagnosis not present

## 2024-10-08 DIAGNOSIS — I739 Peripheral vascular disease, unspecified: Secondary | ICD-10-CM | POA: Diagnosis not present
